# Patient Record
Sex: Female | Born: 1975 | Race: White | Hispanic: Yes | Marital: Married | Smoking: Never smoker
Health system: Southern US, Community
[De-identification: ages and names within clinical notes are randomized; demographics above are authoritative.]

## PROBLEM LIST (undated history)

## (undated) DIAGNOSIS — R251 Tremor, unspecified: Secondary | ICD-10-CM

## (undated) DIAGNOSIS — K589 Irritable bowel syndrome without diarrhea: Secondary | ICD-10-CM

## (undated) DIAGNOSIS — F419 Anxiety disorder, unspecified: Secondary | ICD-10-CM

## (undated) DIAGNOSIS — J40 Bronchitis, not specified as acute or chronic: Secondary | ICD-10-CM

## (undated) HISTORY — PX: TONSILLECTOMY: SUR1361

---

## 2007-12-21 LAB — HM COLONOSCOPY: HM Colonoscopy: NORMAL

## 2009-12-19 HISTORY — PX: KNEE SURGERY: SHX244

## 2010-12-19 HISTORY — PX: BREAST BIOPSY: SHX20

## 2010-12-19 HISTORY — PX: BREAST LUMPECTOMY: SHX2

## 2013-06-18 LAB — HM MAMMOGRAPHY: HM Mammogram: NORMAL

## 2013-11-18 LAB — HM PAP SMEAR: HM PAP: NORMAL

## 2013-12-03 ENCOUNTER — Ambulatory Visit: Payer: Self-pay

## 2014-06-19 LAB — CBC AND DIFFERENTIAL: Hemoglobin: 14.2 g/dL (ref 12.0–16.0)

## 2014-06-19 LAB — LIPID PANEL
CHOLESTEROL: 207 mg/dL — AB (ref 0–200)
HDL: 84 mg/dL — AB (ref 35–70)
LDL CALC: 112 mg/dL
Triglycerides: 56 mg/dL (ref 40–160)

## 2014-06-19 LAB — TSH: TSH: 2.2 u[IU]/mL (ref <=5.90)

## 2014-07-27 ENCOUNTER — Emergency Department: Payer: Self-pay | Admitting: Emergency Medicine

## 2014-09-23 ENCOUNTER — Ambulatory Visit: Payer: Self-pay | Admitting: Internal Medicine

## 2014-11-24 ENCOUNTER — Ambulatory Visit: Payer: Self-pay | Admitting: Physician Assistant

## 2014-12-19 HISTORY — PX: BREAST EXCISIONAL BIOPSY: SUR124

## 2015-04-16 ENCOUNTER — Ambulatory Visit
Admit: 2015-04-16 | Disposition: A | Discharge status: Home / Self Care | Payer: Self-pay | Attending: Internal Medicine | Admitting: Internal Medicine

## 2015-06-08 ENCOUNTER — Other Ambulatory Visit: Payer: Self-pay | Admitting: Internal Medicine

## 2015-06-19 HISTORY — PX: APPENDECTOMY: SHX54

## 2015-06-21 ENCOUNTER — Encounter: Payer: Self-pay | Admitting: Gynecology

## 2015-06-21 ENCOUNTER — Ambulatory Visit
Admission: EM | Admit: 2015-06-21 | Discharge: 2015-06-21 | Disposition: A | Discharge status: Home / Self Care | Payer: 59 | Attending: Family Medicine | Admitting: Family Medicine

## 2015-06-21 DIAGNOSIS — H9201 Otalgia, right ear: Secondary | ICD-10-CM

## 2015-06-21 HISTORY — DX: Anxiety disorder, unspecified: F41.9

## 2015-06-21 HISTORY — DX: Bronchitis, not specified as acute or chronic: J40

## 2015-06-21 HISTORY — DX: Irritable bowel syndrome, unspecified: K58.9

## 2015-06-21 HISTORY — DX: Tremor, unspecified: R25.1

## 2015-06-21 NOTE — ED Provider Notes (Signed)
Patient presents today with symptoms of right ear pain. Patient states that she has had the symptoms for the last 3 days. She denies any recent swimming. She denies any fever, nasal congestion, cough, headache, discharge from the ear. She did have a left ear infection about one and a half months ago. Denies any problems with hearing or dizziness.  ROS: Negative except mentioned above. Vitals as per Epic.  GENERAL: NAD HEENT: no pharyngeal erythema, no exudate, no erythema of TMs, no tenderness with movt of pinna, no discharge from ear, multiple ear piercings but no evidence of erythema or tenderness of sites, no cervical LAD RESP: CTA B CARD: RRR NEURO: CN II-XII grossly intact   A/P: Right otalgia-there does not appear to be any evidence of an ear infection at this time, asked patient to try ibuprofen and antihistamine/decongestant to see if this would improve her symptoms. If her symptoms do persist or worsen I do recommend that she follow up with her primary care physician or ENT.   Paulina Fusi, MD 06/21/15 1256

## 2015-06-21 NOTE — ED Notes (Signed)
Patient c/o Right ear infection. Pt. Stated pain at right ear x 3 days.

## 2015-06-24 ENCOUNTER — Other Ambulatory Visit: Payer: Self-pay | Admitting: Internal Medicine

## 2015-07-07 ENCOUNTER — Encounter: Payer: Self-pay | Admitting: Internal Medicine

## 2015-07-13 ENCOUNTER — Encounter: Payer: Self-pay | Admitting: Internal Medicine

## 2015-07-13 ENCOUNTER — Other Ambulatory Visit: Payer: Self-pay | Admitting: Internal Medicine

## 2015-07-13 DIAGNOSIS — K581 Irritable bowel syndrome with constipation: Secondary | ICD-10-CM | POA: Insufficient documentation

## 2015-07-13 DIAGNOSIS — J4520 Mild intermittent asthma, uncomplicated: Secondary | ICD-10-CM | POA: Insufficient documentation

## 2015-07-13 DIAGNOSIS — F419 Anxiety disorder, unspecified: Secondary | ICD-10-CM | POA: Insufficient documentation

## 2015-07-13 DIAGNOSIS — G250 Essential tremor: Secondary | ICD-10-CM | POA: Insufficient documentation

## 2015-07-14 ENCOUNTER — Ambulatory Visit (INDEPENDENT_AMBULATORY_CARE_PROVIDER_SITE_OTHER): Payer: 59 | Admitting: Internal Medicine

## 2015-07-14 ENCOUNTER — Encounter: Payer: Self-pay | Admitting: Internal Medicine

## 2015-07-14 VITALS — BP 110/64 | HR 80 | Ht 62.0 in | Wt 124.2 lb

## 2015-07-14 DIAGNOSIS — R21 Rash and other nonspecific skin eruption: Secondary | ICD-10-CM | POA: Diagnosis not present

## 2015-07-14 DIAGNOSIS — Z09 Encounter for follow-up examination after completed treatment for conditions other than malignant neoplasm: Secondary | ICD-10-CM | POA: Diagnosis not present

## 2015-07-14 NOTE — Progress Notes (Signed)
Date:  07/14/2015   Name:  Nancy Vega   DOB:  September 01, 1976   MRN:  [id number]   Chief Complaint: Hospitalization Follow-up Rash This is a new problem. The current episode started in the past 7 days. The problem has been gradually improving since onset. The rash is diffuse. The rash is characterized by redness. Associated with: received multiple medications at surgery. Pertinent negatives include no anorexia, cough, diarrhea, fatigue, fever, joint pain, shortness of breath or sore throat.   - Admitted to [facility] with acute appendicitis.  She is here for followup (surgeon won't see her for 2 weeks).  She is leaving to travel to [place] tomorrow.  She feels well with only minor abdominal discomfort.  The laparoscopic incisions are intact with skin glue.  Her bowels are moving normally.  She is eating normally.  She denies fever, chills or headache.  Review of Systems:  Review of Systems  Constitutional: Negative for fever, chills, appetite change and fatigue.  HENT: Negative for sore throat.   Respiratory: Negative for cough and shortness of breath.   Cardiovascular: Negative for chest pain.  Gastrointestinal: Negative for diarrhea, constipation, abdominal distention and anorexia.  Genitourinary: Negative for dysuria, urgency and pelvic pain.  Musculoskeletal: Negative for joint pain.  Skin: Positive for rash.    Patient Active Problem List   Diagnosis Date Noted  . Anxiety disorder 07/13/2015  . Chronic bronchitis, simple 07/13/2015  . Essential tremor 07/13/2015  . IBS (irritable bowel syndrome) 07/13/2015    Prior to Admission medications   Medication Sig Start Date End Date Taking? Authorizing Provider  buPROPion (WELLBUTRIN XL) 150 MG 24 hr tablet TAKE (1) TABLET BY MOUTH DAILY AT BEDTIME 06/24/15  Yes Glean Hess, MD  buPROPion (WELLBUTRIN XL) 300 MG 24 hr tablet Take 300 mg by mouth every morning.   Yes Historical Provider, MD  levonorgestrel (MIRENA) 20 MCG/24HR IUD 1 each  by Intrauterine route once.   Yes Historical Provider, MD  montelukast (SINGULAIR) 10 MG tablet TAKE ONE (1) TABLET BY MOUTH ONCE DAILY 06/08/15  Yes Glean Hess, MD  primidone (MYSOLINE) 50 MG tablet Take by mouth 2 (two) times daily.   Yes Historical Provider, MD  mometasone-formoterol (DULERA) 100-5 MCG/ACT AERO Inhale 2 puffs into the lungs 2 (two) times daily.    Historical Provider, MD    Allergies  Allergen Reactions  . Amoxicillin Hives  . Cefdinir Other (See Comments)  . Nsaids Other (See Comments)    Tremor intensity  . Tamiflu [Oseltamivir] Hives  . Zithromax [Azithromycin] Hives and Other (See Comments)    Past Surgical History  Procedure Laterality Date  . Appendectomy  06/2015  . Knee surgery Right 2011  . Breast excisional biopsy Right   . Tonsillectomy Bilateral     History  Substance Use Topics  . Smoking status: Never Smoker   . Smokeless tobacco: Not on file  . Alcohol Use: 1.2 oz/week    2 Standard drinks or equivalent per week     Comment: occassion     Medication list has been reviewed and updated.  Physical Examination:  Physical Exam  Constitutional: She appears well-developed and well-nourished.  Neck: Normal range of motion. Neck supple.  Cardiovascular: Normal rate, regular rhythm and normal heart sounds.   Pulmonary/Chest: Effort normal and breath sounds normal. She has no wheezes.  Abdominal: Soft. Bowel sounds are normal. She exhibits no distension. There is no tenderness. There is no rebound and no guarding.  Laparoscopic incisions  intact with skin glue  Musculoskeletal: She exhibits no edema.  Skin: Skin is warm and dry. Rash noted. Rash is papular (widespread pinpoint erythematous lesions, blanching, nontender).    BP 110/64 mmHg  Pulse 80  Ht 5\' 2"  (1.575 m)  Wt 124 lb 3.2 oz (56.337 kg)  BMI 22.71 kg/m2  Assessment and Plan: 1. S/P appendectomy, follow-up exam Appears stable - may proceed with travel plans Continue avoid  lifting >5 lbs for the next 2 weeks  2. Rash of unknown cause Possibly viral; medication reaction unlikely due to timing Since asymptomatic and gradually improving will observe   Halina Maidens, MD [facility]  07/14/2015

## 2015-09-16 ENCOUNTER — Other Ambulatory Visit: Payer: Self-pay | Admitting: Internal Medicine

## 2015-09-16 ENCOUNTER — Other Ambulatory Visit: Payer: Self-pay

## 2015-09-16 MED ORDER — BUPROPION HCL ER (XL) 300 MG PO TB24: 300.0000 mg | ORAL_TABLET | Freq: Every morning | ORAL | Status: DC

## 2015-09-30 ENCOUNTER — Ambulatory Visit
Admission: EM | Admit: 2015-09-30 | Discharge: 2015-09-30 | Disposition: A | Discharge status: Home / Self Care | Payer: 59 | Attending: Family Medicine | Admitting: Family Medicine

## 2015-09-30 ENCOUNTER — Encounter: Payer: Self-pay | Admitting: *Deleted

## 2015-09-30 DIAGNOSIS — J069 Acute upper respiratory infection, unspecified: Secondary | ICD-10-CM

## 2015-09-30 DIAGNOSIS — H6500 Acute serous otitis media, unspecified ear: Secondary | ICD-10-CM

## 2015-09-30 MED ORDER — BENZONATATE 200 MG PO CAPS: 200.0000 mg | ORAL_CAPSULE | Freq: Three times a day (TID) | ORAL | Status: DC | PRN

## 2015-09-30 MED ORDER — SULFAMETHOXAZOLE-TRIMETHOPRIM 800-160 MG PO TABS: 1.0000 | ORAL_TABLET | Freq: Two times a day (BID) | ORAL | Status: DC

## 2015-09-30 NOTE — ED Provider Notes (Signed)
CSN: [id number]     Arrival date & time 09/30/15  1724 History   None    Chief Complaint  Patient presents with  . Ear Fullness  . Sore Throat  . Cough   (Consider location/radiation/quality/duration/timing/severity/associated sxs/prior Treatment) HPI Comments: 39 yo female with a 2 days h/o ear pain, congestion, cough, sore throat, fevers. Denies chest pain or shortness of breath.   The history is provided by the patient.    Past Medical History  Diagnosis Date  . Anxiety   . Bronchitis     chronic  . Tremor   . IBS (irritable bowel syndrome)    Past Surgical History  Procedure Laterality Date  . Appendectomy  06/2015  . Knee surgery Right 2011  . Breast excisional biopsy Right   . Tonsillectomy Bilateral    Family History  Problem Relation Age of Onset  . Cancer Mother     Cervical   Social History  Substance Use Topics  . Smoking status: Never Smoker   . Smokeless tobacco: None  . Alcohol Use: 1.2 oz/week    2 Standard drinks or equivalent per week     Comment: occassion   OB History    No data available     Review of Systems  Allergies  Amoxicillin; Cefdinir; Nsaids; Tamiflu; and Zithromax  Home Medications   Prior to Admission medications   Medication Sig Start Date End Date Taking? Authorizing Provider  benzonatate (TESSALON) 200 MG capsule Take 1 capsule (200 mg total) by mouth 3 (three) times daily as needed for cough. 09/30/15   Norval Gable, MD  buPROPion (WELLBUTRIN XL) 150 MG 24 hr tablet TAKE (1) TABLET BY MOUTH DAILY AT BEDTIME 06/24/15   Glean Hess, MD  buPROPion (WELLBUTRIN XL) 300 MG 24 hr tablet Take 1 tablet (300 mg total) by mouth every morning. 09/16/15   Glean Hess, MD  levonorgestrel (MIRENA) 20 MCG/24HR IUD 1 each by Intrauterine route once.    Historical Provider, MD  mometasone-formoterol (DULERA) 100-5 MCG/ACT AERO Inhale 2 puffs into the lungs 2 (two) times daily.    Historical Provider, MD  montelukast (SINGULAIR) 10  MG tablet TAKE ONE (1) TABLET BY MOUTH ONCE DAILY 06/08/15   Glean Hess, MD  primidone (MYSOLINE) 50 MG tablet Take by mouth 2 (two) times daily.    Historical Provider, MD  sulfamethoxazole-trimethoprim (BACTRIM DS,SEPTRA DS) 800-160 MG tablet Take 1 tablet by mouth 2 (two) times daily. 09/30/15   Norval Gable, MD   Meds Ordered and Administered this Visit  Medications - No data to display  BP 129/87 mmHg  Pulse 98  Temp(Src) 98.1 F (36.7 C) (Oral)  Ht 5\' 2"  (1.575 m)  Wt 126 lb (57.153 kg)  BMI 23.04 kg/m2  SpO2 99% No data found.   Physical Exam  Constitutional: She appears well-developed and well-nourished. No distress.  HENT:  Head: Normocephalic.  Right Ear: Tympanic membrane is erythematous and bulging.  Left Ear: Tympanic membrane is erythematous and bulging.  Nose: Mucosal edema and rhinorrhea present.  Mouth/Throat: Mucous membranes are normal. Posterior oropharyngeal erythema present. No oropharyngeal exudate, posterior oropharyngeal edema or tonsillar abscesses.  Eyes: Conjunctivae are normal. Right eye exhibits no discharge. Left eye exhibits no discharge.  Neck: Neck supple.  Cardiovascular: Normal rate, regular rhythm and normal heart sounds.   Pulmonary/Chest: Effort normal and breath sounds normal. No respiratory distress. She has no wheezes. She has no rales.  Lymphadenopathy:    She has no cervical  adenopathy.  Neurological: She is alert.  Skin: No rash noted. She is not diaphoretic.  Nursing note and vitals reviewed.   ED Course  Procedures (including critical care time)  Labs Review Labs Reviewed - No data to display  Imaging Review No results found.   Visual Acuity Review  Right Eye Distance:   Left Eye Distance:   Bilateral Distance:    Right Eye Near:   Left Eye Near:    Bilateral Near:         MDM   1. Acute serous otitis media, recurrence not specified, unspecified laterality   2. URI (upper respiratory infection)     Discharge Medication List as of 09/30/2015  6:31 PM    START taking these medications   Details  benzonatate (TESSALON) 200 MG capsule Take 1 capsule (200 mg total) by mouth 3 (three) times daily as needed for cough., Starting 09/30/2015, Until Discontinued, Normal    sulfamethoxazole-trimethoprim (BACTRIM DS,SEPTRA DS) 800-160 MG tablet Take 1 tablet by mouth 2 (two) times daily., Starting 09/30/2015, Until Discontinued, Normal      1. diagnosis reviewed with patient 2. rx as per orders above; reviewed possible side effects, interactions, risks and benefits  3. Recommend supportive treatment with otc analgesics, rest, increased fluids 4. Follow prn if symptoms worsen or don't improve    Norval Gable, MD 09/30/15 [phone]

## 2015-09-30 NOTE — ED Notes (Signed)
Pt states that she has had sore throat, cough and ear full, that started yesterday

## 2015-11-15 ENCOUNTER — Other Ambulatory Visit: Payer: Self-pay | Admitting: Internal Medicine

## 2015-11-16 ENCOUNTER — Ambulatory Visit (INDEPENDENT_AMBULATORY_CARE_PROVIDER_SITE_OTHER): Payer: 59 | Admitting: Internal Medicine

## 2015-11-16 ENCOUNTER — Encounter: Payer: Self-pay | Admitting: Internal Medicine

## 2015-11-16 VITALS — BP 104/64 | HR 64 | Ht 62.0 in | Wt 123.4 lb

## 2015-11-16 DIAGNOSIS — R945 Abnormal results of liver function studies: Secondary | ICD-10-CM

## 2015-11-16 DIAGNOSIS — F411 Generalized anxiety disorder: Secondary | ICD-10-CM

## 2015-11-16 DIAGNOSIS — G250 Essential tremor: Secondary | ICD-10-CM

## 2015-11-16 DIAGNOSIS — E785 Hyperlipidemia, unspecified: Secondary | ICD-10-CM | POA: Diagnosis not present

## 2015-11-16 DIAGNOSIS — Z0000 Encounter for general adult medical examination without abnormal findings: Secondary | ICD-10-CM

## 2015-11-16 DIAGNOSIS — R7989 Other specified abnormal findings of blood chemistry: Secondary | ICD-10-CM | POA: Diagnosis not present

## 2015-11-16 DIAGNOSIS — J410 Simple chronic bronchitis: Secondary | ICD-10-CM | POA: Diagnosis not present

## 2015-11-16 DIAGNOSIS — K589 Irritable bowel syndrome without diarrhea: Secondary | ICD-10-CM | POA: Diagnosis not present

## 2015-11-16 LAB — POCT URINALYSIS DIPSTICK
BILIRUBIN UA: NEGATIVE
GLUCOSE UA: NEGATIVE
Ketones, UA: NEGATIVE
Leukocytes, UA: NEGATIVE
Nitrite, UA: NEGATIVE
Protein, UA: NEGATIVE
Urobilinogen, UA: 0.2
pH, UA: 5

## 2015-11-16 NOTE — Patient Instructions (Signed)
Breast Self-Awareness Practicing breast self-awareness may pick up problems early, prevent significant medical complications, and possibly save your life. By practicing breast self-awareness, you can become familiar with how your breasts look and feel and if your breasts are changing. This allows you to notice changes early. It can also offer you some reassurance that your breast health is good. One way to learn what is normal for your breasts and whether your breasts are changing is to do a breast self-exam. If you find a lump or something that was not present in the past, it is best to contact your caregiver right away. Other findings that should be evaluated by your caregiver include nipple discharge, especially if it is bloody; skin changes or reddening; areas where the skin seems to be pulled in (retracted); or new lumps and bumps. Breast pain is seldom associated with cancer (malignancy), but should also be evaluated by a caregiver. HOW TO PERFORM A BREAST SELF-EXAM The best time to examine your breasts is 5-7 days after your menstrual period is over. During menstruation, the breasts are lumpier, and it may be more difficult to pick up changes. If you do not menstruate, have reached menopause, or had your uterus removed (hysterectomy), you should examine your breasts at regular intervals, such as monthly. If you are breastfeeding, examine your breasts after a feeding or after using a breast pump. Breast implants do not decrease the risk for lumps or tumors, so continue to perform breast self-exams as recommended. Talk to your caregiver about how to determine the difference between the implant and breast tissue. Also, talk about the amount of pressure you should use during the exam. Over time, you will become more familiar with the variations of your breasts and more comfortable with the exam. A breast self-exam requires you to remove all your clothes above the waist. 1. Look at your breasts and nipples.  Stand in front of a mirror in a room with good lighting. With your hands on your hips, push your hands firmly downward. Look for a difference in shape, contour, and size from one breast to the other (asymmetry). Asymmetry includes puckers, dips, or bumps. Also, look for skin changes, such as reddened or scaly areas on the breasts. Look for nipple changes, such as discharge, dimpling, repositioning, or redness. 2. Carefully feel your breasts. This is best done either in the shower or tub while using soapy water or when flat on your back. Place the arm (on the side of the breast you are examining) above your head. Use the pads (not the fingertips) of your three middle fingers on your opposite hand to feel your breasts. Start in the underarm area and use  inch (2 cm) overlapping circles to feel your breast. Use 3 different levels of pressure (light, medium, and firm pressure) at each circle before moving to the next circle. The light pressure is needed to feel the tissue closest to the skin. The medium pressure will help to feel breast tissue a little deeper, while the firm pressure is needed to feel the tissue close to the ribs. Continue the overlapping circles, moving downward over the breast until you feel your ribs below your breast. Then, move one finger-width towards the center of the body. Continue to use the  inch (2 cm) overlapping circles to feel your breast as you move slowly up toward the collar bone (clavicle) near the base of the neck. Continue the up and down exam using all 3 pressures until you reach the   middle of the chest. Do this with each breast, carefully feeling for lumps or changes. 3.  Keep a written record with breast changes or normal findings for each breast. By writing this information down, you do not need to depend only on memory for size, tenderness, or location. Write down where you are in your menstrual cycle, if you are still menstruating. Breast tissue can have some lumps or  thick tissue. However, see your caregiver if you find anything that concerns you.  SEEK MEDICAL CARE IF:  You see a change in shape, contour, or size of your breasts or nipples.   You see skin changes, such as reddened or scaly areas on the breasts or nipples.   You have an unusual discharge from your nipples.   You feel a new lump or unusually thick areas.    This information is not intended to replace advice given to you by your health care provider. Make sure you discuss any questions you have with your health care provider.   Document Released: 12/05/2005 Document Revised: 11/21/2012 Document Reviewed: 03/21/2012 Elsevier Interactive Patient Education 2016 [employer]  

## 2015-11-16 NOTE — Progress Notes (Signed)
Date:  11/16/2015   Name:  Nancy Vega   DOB:  September 18, 1976   MRN:  [id number]   Chief Complaint: Annual Exam Nancy Vega is a 39 y.o. female who presents today for her Complete Annual Exam. She feels well. She reports exercising regularly. She reports she is sleeping well. 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 Anxiety disorder - doing well on bupropion 450 mg daily. She is coping well on traveling without difficulty. She uses Xanax for long air travel but otherwise does not take it. Tremor - recently seen by neurology. Dose was not adjusted due to intolerance of higher doses in the past. She is coping well with the tremor and it does not interfere with her daily activities. Health maintenance - plans to see GYN in [place] state next week  Review of Systems  Constitutional: Negative for fever, chills and fatigue.  HENT: Negative for hearing loss, sinus pressure, tinnitus, trouble swallowing and voice change.   Eyes: Negative for visual disturbance.  Respiratory: Negative for cough, chest tightness, shortness of breath and wheezing.   Cardiovascular: Negative for chest pain, palpitations and leg swelling.  Gastrointestinal: Negative for abdominal pain and blood in stool.  Endocrine: Positive for cold intolerance. Negative for polydipsia and polyuria.  Genitourinary: Negative for dysuria, urgency, frequency, vaginal bleeding and vaginal discharge.  Musculoskeletal: Positive for arthralgias. Negative for myalgias and joint swelling.  Skin: Positive for pallor. Negative for rash and wound.  Allergic/Immunologic: Negative for environmental allergies.  Neurological: Positive for tremors. Negative for light-headedness, numbness and headaches.  Psychiatric/Behavioral: Negative for suicidal ideas and dysphoric mood.    Patient Active Problem List   Diagnosis Date Noted  . Anxiety disorder 07/13/2015  . Chronic bronchitis, simple ([place])  07/13/2015  . Essential tremor 07/13/2015  . IBS (irritable bowel syndrome) 07/13/2015    Prior to Admission medications   Medication Sig Start Date End Date Taking? Authorizing Provider  ALPRAZolam Duanne Moron) 0.5 MG tablet Take 1 tablet by mouth 2 (two) times daily as needed.   Yes Historical Provider, MD  benzonatate (TESSALON) 200 MG capsule Take 1 capsule (200 mg total) by mouth 3 (three) times daily as needed for cough. 09/30/15  Yes Norval Gable, MD  buPROPion (WELLBUTRIN XL) 150 MG 24 hr tablet TAKE (1) TABLET BY MOUTH DAILY AT BEDTIME 06/24/15  Yes Glean Hess, MD  buPROPion (WELLBUTRIN XL) 300 MG 24 hr tablet Take 1 tablet (300 mg total) by mouth every morning. 09/16/15  Yes Glean Hess, MD  levonorgestrel (MIRENA) 20 MCG/24HR IUD 1 each by Intrauterine route once.   Yes Historical Provider, MD  montelukast (SINGULAIR) 10 MG tablet TAKE ONE (1) TABLET BY MOUTH ONCE DAILY 06/08/15  Yes Glean Hess, MD  primidone (MYSOLINE) 50 MG tablet Take by mouth 2 (two) times daily.   Yes Historical Provider, MD  mometasone-formoterol (DULERA) 100-5 MCG/ACT AERO Inhale 2 puffs into the lungs 2 (two) times daily.    Historical Provider, MD    Allergies  Allergen Reactions  . Amoxicillin Hives  . Cefdinir Other (See Comments)  . Nsaids Other (See Comments)    Tremor intensity  . Tamiflu [Oseltamivir] Hives  . Zithromax [Azithromycin] Hives and Other (See Comments)    Past Surgical History  Procedure Laterality Date  . Appendectomy  06/2015  . Knee surgery Right 2011  . Breast excisional biopsy Right   . Tonsillectomy Bilateral     Social  History  Substance Use Topics  . Smoking status: Never Smoker   . Smokeless tobacco: Not on file  . Alcohol Use: 1.2 oz/week    2 Standard drinks or equivalent per week     Comment: occassion     Medication list has been reviewed and updated.   Physical Exam  Constitutional: She is oriented to person, place, and time. She appears  well-developed and well-nourished. No distress.  HENT:  Head: Normocephalic and atraumatic.  Right Ear: Tympanic membrane and ear canal normal.  Left Ear: Tympanic membrane and ear canal normal.  Nose: Right sinus exhibits no maxillary sinus tenderness. Left sinus exhibits no maxillary sinus tenderness.  Mouth/Throat: Uvula is midline and oropharynx is clear and moist.  Eyes: Conjunctivae and EOM are normal. Right eye exhibits no discharge. Left eye exhibits no discharge. No scleral icterus.  Neck: Normal range of motion. Carotid bruit is not present. No erythema present. No thyromegaly present.  Cardiovascular: Normal rate, regular rhythm, normal heart sounds and normal pulses.   Pulmonary/Chest: Effort normal. No respiratory distress. She has no wheezes.  Abdominal: Soft. Bowel sounds are normal. There is no hepatosplenomegaly. There is generalized tenderness. There is no CVA tenderness.  Musculoskeletal: Normal range of motion.  Lymphadenopathy:    She has no cervical adenopathy.    She has no axillary adenopathy.  Neurological: She is alert and oriented to person, place, and time. She has normal reflexes. No cranial nerve deficit or sensory deficit.  Skin: Skin is warm, dry and intact. No rash noted.  Psychiatric: She has a normal mood and affect. Her speech is normal and behavior is normal. Thought content normal.  Nursing note and vitals reviewed.   BP 104/64 mmHg  Pulse 64  Ht 5\' 2"  (1.575 m)  Wt 123 lb 6.4 oz (55.974 kg)  BMI 22.56 kg/m2  Assessment and Plan: 1. Annual physical exam Continue self breast exams and follow-up with GYN as planned - POCT urinalysis dipstick  2. IBS (irritable bowel syndrome) Stable currently with minimal symptoms - CBC with Differential/Platelet  3. Essential tremor Controlled on primidone - TSH  4. Generalized anxiety disorder Doing well with bupropion; continue current therapy  5. Chronic bronchitis, simple ([place]) No recent  symptoms Continue Singulair and Dulera  6. Elevated LFTs Being monitored due to chronic medications - Comprehensive metabolic panel  7. Mild hyperlipidemia Will check lipids and advise - Lipid panel   Halina Maidens, MD [facility]  11/16/2015

## 2015-11-17 LAB — COMPREHENSIVE METABOLIC PANEL
A/G RATIO: 2.2 (ref 1.1–2.5)
ALBUMIN: 4.8 g/dL (ref 3.5–5.5)
ALK PHOS: 74 IU/L (ref 39–117)
ALT: 19 IU/L (ref 0–32)
AST: 17 IU/L (ref 0–40)
BUN / CREAT RATIO: 17 (ref 8–20)
BUN: 19 mg/dL (ref 6–20)
Bilirubin Total: 0.7 mg/dL (ref 0.0–1.2)
CO2: 24 mmol/L (ref 18–29)
CREATININE: 1.09 mg/dL — AB (ref 0.57–1.00)
Calcium: 9.2 mg/dL (ref 8.7–10.2)
Chloride: 98 mmol/L (ref 97–106)
GFR calc Af Amer: 74 mL/min/{1.73_m2} (ref >59)
GFR calc non Af Amer: 64 mL/min/{1.73_m2} (ref >59)
GLOBULIN, TOTAL: 2.2 g/dL (ref 1.5–4.5)
Glucose: 70 mg/dL (ref 65–99)
POTASSIUM: 4.4 mmol/L (ref 3.5–5.2)
SODIUM: 140 mmol/L (ref 136–144)
Total Protein: 7 g/dL (ref 6.0–8.5)

## 2015-11-17 LAB — LIPID PANEL
CHOL/HDL RATIO: 2.6 ratio (ref 0.0–4.4)
CHOLESTEROL TOTAL: 198 mg/dL (ref 100–199)
HDL: 77 mg/dL (ref >39)
LDL CALC: 112 mg/dL — AB (ref 0–99)
Triglycerides: 45 mg/dL (ref 0–149)
VLDL Cholesterol Cal: 9 mg/dL (ref 5–40)

## 2015-11-17 LAB — CBC WITH DIFFERENTIAL/PLATELET
BASOS: 0 %
Basophils Absolute: 0 10*3/uL (ref 0.0–0.2)
EOS (ABSOLUTE): 0 10*3/uL (ref 0.0–0.4)
EOS: 0 %
Hematocrit: 40.6 % (ref 34.0–46.6)
Hemoglobin: 13.8 g/dL (ref 11.1–15.9)
Immature Grans (Abs): 0 10*3/uL (ref 0.0–0.1)
Immature Granulocytes: 0 %
LYMPHS: 23 %
Lymphocytes Absolute: 1.7 10*3/uL (ref 0.7–3.1)
MCH: 31.9 pg (ref 26.6–33.0)
MCHC: 34 g/dL (ref 31.5–35.7)
MCV: 94 fL (ref 79–97)
Monocytes Absolute: 0.4 10*3/uL (ref 0.1–0.9)
Monocytes: 5 %
NEUTROS PCT: 72 %
Neutrophils Absolute: 5.3 10*3/uL (ref 1.4–7.0)
PLATELETS: 293 10*3/uL (ref 150–379)
RBC: 4.33 x10E6/uL (ref 3.77–5.28)
RDW: 13.4 % (ref 12.3–15.4)
WBC: 7.4 10*3/uL (ref 3.4–10.8)

## 2015-11-17 LAB — TSH: TSH: 2.02 u[IU]/mL (ref 0.450–4.500)

## 2016-03-21 ENCOUNTER — Other Ambulatory Visit: Payer: Self-pay | Admitting: Internal Medicine

## 2016-03-21 DIAGNOSIS — N6011 Diffuse cystic mastopathy of right breast: Secondary | ICD-10-CM | POA: Insufficient documentation

## 2016-03-21 DIAGNOSIS — R922 Inconclusive mammogram: Secondary | ICD-10-CM

## 2016-03-21 MED ORDER — BUPROPION HCL ER (XL) 150 MG PO TB24: 150.0000 mg | ORAL_TABLET | Freq: Every day | ORAL | Status: DC

## 2016-04-18 HISTORY — PX: BREAST BIOPSY: SHX20

## 2016-04-18 HISTORY — PX: BREAST EXCISIONAL BIOPSY: SUR124

## 2016-04-19 ENCOUNTER — Other Ambulatory Visit: Payer: 59

## 2016-04-19 ENCOUNTER — Ambulatory Visit: Payer: 59

## 2016-04-28 ENCOUNTER — Ambulatory Visit
Admission: RE | Admit: 2016-04-28 | Discharge: 2016-04-28 | Disposition: A | Discharge status: Home / Self Care | Payer: BLUE CROSS/BLUE SHIELD | Source: Ambulatory Visit | Attending: Internal Medicine | Admitting: Internal Medicine

## 2016-04-28 DIAGNOSIS — R922 Inconclusive mammogram: Secondary | ICD-10-CM | POA: Diagnosis present

## 2016-04-28 DIAGNOSIS — R928 Other abnormal and inconclusive findings on diagnostic imaging of breast: Secondary | ICD-10-CM | POA: Insufficient documentation

## 2016-04-28 DIAGNOSIS — N63 Unspecified lump in breast: Secondary | ICD-10-CM | POA: Insufficient documentation

## 2016-04-29 ENCOUNTER — Other Ambulatory Visit: Payer: Self-pay | Admitting: Internal Medicine

## 2016-04-29 DIAGNOSIS — R928 Other abnormal and inconclusive findings on diagnostic imaging of breast: Secondary | ICD-10-CM

## 2016-05-02 ENCOUNTER — Encounter: Payer: Self-pay | Admitting: Emergency Medicine

## 2016-05-02 ENCOUNTER — Ambulatory Visit
Admission: EM | Admit: 2016-05-02 | Discharge: 2016-05-02 | Disposition: A | Discharge status: Home / Self Care | Payer: 59 | Attending: Family Medicine | Admitting: Family Medicine

## 2016-05-02 DIAGNOSIS — N390 Urinary tract infection, site not specified: Secondary | ICD-10-CM

## 2016-05-02 LAB — URINALYSIS COMPLETE WITH MICROSCOPIC (ARMC ONLY)
Bilirubin Urine: NEGATIVE
Glucose, UA: NEGATIVE mg/dL
KETONES UR: NEGATIVE mg/dL
Nitrite: NEGATIVE
PH: 7.5 (ref 5.0–8.0)
PROTEIN: 100 mg/dL — AB
SPECIFIC GRAVITY, URINE: 1.02 (ref 1.005–1.030)

## 2016-05-02 MED ORDER — SULFAMETHOXAZOLE-TRIMETHOPRIM 800-160 MG PO TABS: 1.0000 | ORAL_TABLET | Freq: Two times a day (BID) | ORAL | Status: DC

## 2016-05-02 NOTE — ED Provider Notes (Signed)
CSN: [id number]     Arrival date & time 05/02/16  1832 History   First MD Initiated Contact with Patient 05/02/16 1855     Chief Complaint  Patient presents with  . Urinary Tract Infection   (Consider location/radiation/quality/duration/timing/severity/associated sxs/prior Treatment) Patient is a 40 y.o. female presenting with urinary tract infection.  Urinary Tract Infection Pain quality:  Burning Pain severity:  Mild Onset quality:  Sudden Duration:  1 day Timing:  Constant Progression:  Worsening Chronicity:  New Recent urinary tract infections: no   Relieved by:  None tried Urinary symptoms: hematuria   Urinary symptoms: no discolored urine, no foul-smelling urine, no frequent urination, no hesitancy and no bladder incontinence   Associated symptoms: no abdominal pain, no fever, no flank pain, no genital lesions, no nausea, no vaginal discharge and no vomiting   Risk factors: no hx of pyelonephritis, no hx of urolithiasis, no kidney transplant, not pregnant, no recurrent urinary tract infections, no renal cysts, no renal disease, not sexually active, not single kidney, no sexually transmitted infections and no urinary catheter     Past Medical History  Diagnosis Date  . Anxiety   . Bronchitis     chronic  . Tremor   . IBS (irritable bowel syndrome)    Past Surgical History  Procedure Laterality Date  . Appendectomy  06/2015  . Knee surgery Right 2011  . Breast excisional biopsy Right   . Tonsillectomy Bilateral    Family History  Problem Relation Age of Onset  . Cancer Mother     Cervical  . Breast cancer Neg Hx    Social History  Substance Use Topics  . Smoking status: Never Smoker   . Smokeless tobacco: None  . Alcohol Use: 1.2 oz/week    2 Standard drinks or equivalent per week     Comment: occassion   OB History    No data available     Review of Systems  Constitutional: Negative for fever.  Gastrointestinal: Negative for nausea, vomiting and  abdominal pain.  Genitourinary: Negative for flank pain and vaginal discharge.    Allergies  Amoxicillin; Cefdinir; Nsaids; Tamiflu; and Zithromax  Home Medications   Prior to Admission medications   Medication Sig Start Date End Date Taking? Authorizing Provider  ALPRAZolam Duanne Moron) 0.5 MG tablet Take 1 tablet by mouth 2 (two) times daily as needed.    Historical Provider, MD  benzonatate (TESSALON) 200 MG capsule Take 1 capsule (200 mg total) by mouth 3 (three) times daily as needed for cough. 09/30/15   Norval Gable, MD  buPROPion (WELLBUTRIN XL) 150 MG 24 hr tablet Take 1 tablet (150 mg total) by mouth daily. 03/21/16   Glean Hess, MD  buPROPion (WELLBUTRIN XL) 300 MG 24 hr tablet Take 1 tablet (300 mg total) by mouth every morning. 09/16/15   Glean Hess, MD  levonorgestrel (MIRENA) 20 MCG/24HR IUD 1 each by Intrauterine route once.    Historical Provider, MD  mometasone-formoterol (DULERA) 100-5 MCG/ACT AERO Inhale 2 puffs into the lungs 2 (two) times daily.    Historical Provider, MD  montelukast (SINGULAIR) 10 MG tablet TAKE ONE (1) TABLET BY MOUTH ONCE DAILY 06/08/15   Glean Hess, MD  primidone (MYSOLINE) 50 MG tablet Take by mouth 2 (two) times daily.    Historical Provider, MD  sulfamethoxazole-trimethoprim (BACTRIM DS,SEPTRA DS) 800-160 MG tablet Take 1 tablet by mouth 2 (two) times daily. 05/02/16   Norval Gable, MD   Meds Ordered and Administered  this Visit  Medications - No data to display  BP 151/81 mmHg  Pulse 96  Temp(Src) 98.5 F (36.9 C) (Tympanic)  Resp 20  SpO2 99% No data found.   Physical Exam  Constitutional: She appears well-developed and well-nourished. No distress.  Abdominal: Soft. Bowel sounds are normal. She exhibits no distension and no mass. There is tenderness (mild suprapubic). There is no rebound and no guarding.  Skin: She is not diaphoretic.  Nursing note and vitals reviewed.   ED Course  Procedures (including critical care  time)  Labs Review Labs Reviewed  URINALYSIS COMPLETEWITH MICROSCOPIC ([facility] ONLY) - Abnormal; Notable for the following:    Color, Urine RED (*)    APPearance CLOUDY (*)    Hgb urine dipstick 3+ (*)    Protein, ur 100 (*)    Leukocytes, UA 1+ (*)    Bacteria, UA FEW (*)    Squamous Epithelial / LPF 0-5 (*)    All other components within normal limits  URINE CULTURE    Imaging Review No results found.   Visual Acuity Review  Right Eye Distance:   Left Eye Distance:   Bilateral Distance:    Right Eye Near:   Left Eye Near:    Bilateral Near:         MDM   1. UTI (lower urinary tract infection)    Discharge Medication List as of 05/02/2016  7:19 PM    START taking these medications   Details  sulfamethoxazole-trimethoprim (BACTRIM DS,SEPTRA DS) 800-160 MG tablet Take 1 tablet by mouth 2 (two) times daily., Starting 05/02/2016, Until Discontinued, Normal       1. Lab results and diagnosis reviewed with patient 2. rx as per orders above; reviewed possible side effects, interactions, risks and benefits  3. Recommend supportive treatment with increased fluids 4. Follow-up prn if symptoms worsen or don't improve    Norval Gable, MD 05/02/16 1926

## 2016-05-02 NOTE — ED Notes (Signed)
Pt with UTI sx started last night.

## 2016-05-05 LAB — URINE CULTURE
Culture: >=100000 — AB
Special Requests: NORMAL

## 2016-05-09 ENCOUNTER — Ambulatory Visit
Admission: RE | Admit: 2016-05-09 | Discharge: 2016-05-09 | Disposition: A | Discharge status: Home / Self Care | Payer: BLUE CROSS/BLUE SHIELD | Source: Ambulatory Visit | Attending: Internal Medicine | Admitting: Internal Medicine

## 2016-05-09 ENCOUNTER — Ambulatory Visit: Payer: 59

## 2016-05-09 ENCOUNTER — Other Ambulatory Visit: Payer: 59

## 2016-05-09 ENCOUNTER — Other Ambulatory Visit: Payer: Self-pay | Admitting: Internal Medicine

## 2016-05-09 DIAGNOSIS — D242 Benign neoplasm of left breast: Secondary | ICD-10-CM | POA: Diagnosis not present

## 2016-05-09 DIAGNOSIS — R928 Other abnormal and inconclusive findings on diagnostic imaging of breast: Secondary | ICD-10-CM

## 2016-05-10 LAB — SURGICAL PATHOLOGY

## 2016-06-16 ENCOUNTER — Other Ambulatory Visit: Payer: Self-pay | Admitting: Internal Medicine

## 2016-07-26 ENCOUNTER — Ambulatory Visit (INDEPENDENT_AMBULATORY_CARE_PROVIDER_SITE_OTHER): Payer: 59

## 2016-07-26 ENCOUNTER — Ambulatory Visit
Admission: EM | Admit: 2016-07-26 | Discharge: 2016-07-26 | Disposition: A | Discharge status: Home / Self Care | Payer: 59 | Attending: Family Medicine | Admitting: Family Medicine

## 2016-07-26 ENCOUNTER — Encounter: Payer: Self-pay | Admitting: Emergency Medicine

## 2016-07-26 DIAGNOSIS — S9031XA Contusion of right foot, initial encounter: Secondary | ICD-10-CM

## 2016-07-26 DIAGNOSIS — M79604 Pain in right leg: Secondary | ICD-10-CM | POA: Diagnosis not present

## 2016-07-26 DIAGNOSIS — S93601A Unspecified sprain of right foot, initial encounter: Secondary | ICD-10-CM | POA: Diagnosis not present

## 2016-07-26 NOTE — ED Provider Notes (Signed)
[facility]    CSN: [id number] Arrival date & time: 07/26/16  1857  First Provider Contact:  First MD Initiated Contact with Patient 07/26/16 1944        History   Chief Complaint Chief Complaint  Patient presents with  . Foot Pain    HPI Nancy Vega is a 40 y.o. female.   Patient is here after being injured. She states that since she'll child heavyset was unhappy that she could not have anymore candy out of the patient's bag. When the child's mother told her that she was unable to have a more candy the patient states the child stopped her right foot hard in her frustration. And then today she was tending her garden with a painful right foot when she twisted right ankle and foot. It was inversion injury with for the swelling of the right foot resulting in significant amount pain and discomfort and reduced mobility. The patient's had had stress fractures in the right foot before and she states the last time she had wanted wasn't detected right away.  Past smoker history positive for some anxiety IBS and the tremor. She's had tonsillectomy appendectomy knee surgery and several breast biopsies. Mother with cervical cancer she never smoked. She is allergic to Zithromax, Tamiflu, Cefdnir, amoxicillin and NSAIDs.   The history is provided by the patient. No language interpreter was used.  Foot Pain  This is a new problem. The problem has been gradually worsening. Pertinent negatives include no chest pain, no abdominal pain, no headaches and no shortness of breath. The symptoms are aggravated by walking and twisting. Nothing relieves the symptoms. She has tried nothing for the symptoms. The treatment provided no relief.    Past Medical History:  Diagnosis Date  . Anxiety   . Bronchitis    chronic  . IBS (irritable bowel syndrome)   . Tremor     Patient Active Problem List   Diagnosis Date Noted  . Dense breast tissue 03/21/2016  . Anxiety disorder 07/13/2015    . Chronic bronchitis, simple ([place]) 07/13/2015  . Essential tremor 07/13/2015  . IBS (irritable bowel syndrome) 07/13/2015    Past Surgical History:  Procedure Laterality Date  . APPENDECTOMY  06/2015  . BREAST EXCISIONAL BIOPSY Right   . BREAST EXCISIONAL BIOPSY Left 04/2016   fibroadenoma  . KNEE SURGERY Right 2011  . TONSILLECTOMY Bilateral     OB History    No data available       Home Medications    Prior to Admission medications   Medication Sig Start Date End Date Taking? Authorizing Provider  buPROPion (WELLBUTRIN XL) 300 MG 24 hr tablet Take 1 tablet (300 mg total) by mouth every morning. 09/16/15  Yes Glean Hess, MD  montelukast (SINGULAIR) 10 MG tablet TAKE ONE (1) TABLET BY MOUTH ONCE DAILY 06/16/16  Yes Glean Hess, MD  primidone (MYSOLINE) 50 MG tablet Take by mouth 2 (two) times daily.   Yes Historical Provider, MD  ALPRAZolam Duanne Moron) 0.5 MG tablet Take 1 tablet by mouth 2 (two) times daily as needed.    Historical Provider, MD  benzonatate (TESSALON) 200 MG capsule Take 1 capsule (200 mg total) by mouth 3 (three) times daily as needed for cough. 09/30/15   Norval Gable, MD  buPROPion (WELLBUTRIN XL) 150 MG 24 hr tablet Take 1 tablet (150 mg total) by mouth daily. 03/21/16   Glean Hess, MD  levonorgestrel (MIRENA) 20 MCG/24HR IUD 1 each by Intrauterine  route once.    Historical Provider, MD  mometasone-formoterol (DULERA) 100-5 MCG/ACT AERO Inhale 2 puffs into the lungs 2 (two) times daily.    Historical Provider, MD  sulfamethoxazole-trimethoprim (BACTRIM DS,SEPTRA DS) 800-160 MG tablet Take 1 tablet by mouth 2 (two) times daily. 05/02/16   Norval Gable, MD    Family History Family History  Problem Relation Age of Onset  . Cancer Mother     Cervical  . Breast cancer Neg Hx     Social History Social History  Substance Use Topics  . Smoking status: Never Smoker  . Smokeless tobacco: Never Used  . Alcohol use 1.2 oz/week    2 Standard drinks  or equivalent per week     Comment: occassion     Allergies   Amoxicillin; Cefdinir; Iodine solution [povidone iodine]; Nsaids; Tamiflu [oseltamivir]; and Zithromax [azithromycin]   Review of Systems Review of Systems  Respiratory: Negative for shortness of breath.   Cardiovascular: Negative for chest pain.  Gastrointestinal: Negative for abdominal pain.  Neurological: Negative for headaches.  All other systems reviewed and are negative.    Physical Exam Triage Vital Signs ED Triage Vitals  Enc Vitals Group     BP 07/26/16 1937 (!) 118/91     Pulse Rate 07/26/16 1937 83     Resp 07/26/16 1937 18     Temp 07/26/16 1937 98.9 F (37.2 C)     Temp Source 07/26/16 1937 Tympanic     SpO2 07/26/16 1937 100 %     Weight 07/26/16 1937 135 lb (61.2 kg)     Height 07/26/16 1937 5\' 2"  (1.575 m)     Head Circumference --      Peak Flow --      Pain Score 07/26/16 1936 5     Pain Loc --      Pain Edu? --      Excl. in [place]? --    No data found.   Updated Vital Signs BP (!) 118/91 (BP Location: Left Arm)   Pulse 83   Temp 98.9 F (37.2 C) (Tympanic)   Resp 18   Ht 5\' 2"  (1.575 m)   Wt 135 lb (61.2 kg)   SpO2 100%   BMI 24.69 kg/m   Visual Acuity Right Eye Distance:   Left Eye Distance:   Bilateral Distance:    Right Eye Near:   Left Eye Near:    Bilateral Near:     Physical Exam  Constitutional: She is oriented to person, place, and time. She appears well-developed and well-nourished.  HENT:  Head: Normocephalic.  Eyes: Pupils are equal, round, and reactive to light.  Neck: Normal range of motion.  Pulmonary/Chest: Effort normal.  Musculoskeletal: She exhibits edema and tenderness.       Feet:  Patient with tenderness over the fourth and fifth mid metatarsal bones on the right foot there is swelling and ecchymosis present as is intact.  Neurological: She is alert and oriented to person, place, and time.  Skin: Skin is warm.  Psychiatric: She has a normal  mood and affect.  Vitals reviewed.    UC Treatments / Results  Labs (all labs ordered are listed, but only abnormal results are displayed) Labs Reviewed - No data to display  EKG  EKG Interpretation None       Radiology Dg Foot Complete Right  Result Date: 07/26/2016 CLINICAL DATA:  Lateral foot pain from someone stepping on her foot this past weekend and rolling her foot  yesterday. EXAM: RIGHT FOOT COMPLETE - 3+ VIEW COMPARISON:  None. FINDINGS: There is no evidence of fracture or dislocation. There is no evidence of arthropathy or other focal bone abnormality. Soft tissues are unremarkable. IMPRESSION: Negative. Electronically Signed   By: Misty Stanley M.D.   On: 07/26/2016 20:14    Procedures Procedures (including critical care time)  Medications Ordered in UC Medications - No data to display   Initial Impression / Assessment and Plan / UC Course  I have reviewed the triage vital signs and the nursing notes.  Pertinent labs & imaging results that were available during my care of the patient were reviewed by me and considered in my medical decision making (see chart for details).  Clinical Course    X-rays are negative for fracture. This patient has had fractures of the bones before recommend Cam Walker she degrees to for protection use a Cam Walker for about 2 weeks if not better in 2 weeks would recommend repeat x-ray. For the time being ice packs when able to reduce the swelling.  Final Clinical Impressions(s) / UC Diagnoses   Final diagnoses:  Foot contusion, right, initial encounter  Pain of right lower extremity  Foot sprain, right, initial encounter    New Prescriptions Discharge Medication List as of 07/26/2016  8:55 PM       Frederich Cha, MD 07/26/16 2129

## 2016-07-26 NOTE — ED Triage Notes (Signed)
Patient states she thinks she has a stress fracture in her right foot.  Patient states she is prone to stress fracture

## 2016-08-11 NOTE — ED Notes (Signed)
Patient fitted with a CAM walker boot

## 2016-08-23 ENCOUNTER — Other Ambulatory Visit: Payer: Self-pay | Admitting: Internal Medicine

## 2016-10-04 ENCOUNTER — Other Ambulatory Visit: Payer: Self-pay | Admitting: Internal Medicine

## 2016-10-05 NOTE — Telephone Encounter (Signed)
Pt coming in on 3/13

## 2016-12-21 ENCOUNTER — Other Ambulatory Visit: Payer: Self-pay | Admitting: Internal Medicine

## 2017-02-07 ENCOUNTER — Other Ambulatory Visit: Payer: Self-pay | Admitting: Internal Medicine

## 2017-02-08 ENCOUNTER — Encounter: Payer: Self-pay | Admitting: Internal Medicine

## 2017-02-08 ENCOUNTER — Other Ambulatory Visit: Payer: Self-pay | Admitting: Internal Medicine

## 2017-02-08 ENCOUNTER — Ambulatory Visit (INDEPENDENT_AMBULATORY_CARE_PROVIDER_SITE_OTHER): Payer: 59 | Admitting: Internal Medicine

## 2017-02-08 VITALS — BP 112/64 | HR 98 | Ht 62.0 in | Wt 131.2 lb

## 2017-02-08 DIAGNOSIS — N3001 Acute cystitis with hematuria: Secondary | ICD-10-CM

## 2017-02-08 LAB — POCT URINALYSIS DIPSTICK
BILIRUBIN UA: NEGATIVE
Glucose, UA: NEGATIVE
Ketones, UA: NEGATIVE
NITRITE UA: POSITIVE
PH UA: 7
Protein, UA: NEGATIVE
Spec Grav, UA: 1.01
UROBILINOGEN UA: 0.2

## 2017-02-08 MED ORDER — SULFAMETHOXAZOLE-TRIMETHOPRIM 800-160 MG PO TABS: 1.0000 | ORAL_TABLET | Freq: Two times a day (BID) | ORAL | 0 refills | Status: DC

## 2017-02-08 NOTE — Progress Notes (Signed)
Date:  02/08/2017   Name:  Nancy Vega   DOB:  Feb 10, 1976   MRN:  [id number]   Chief Complaint: Urinary Tract Infection (burning, pain, and blood during urination.) Urinary Tract Infection   This is a new problem. The current episode started today. The problem occurs every urination. The problem has been unchanged. The quality of the pain is described as burning. The pain is at a severity of 5/10. The pain is moderate. There has been no fever. Associated symptoms include frequency, hematuria, hesitancy and urgency. Pertinent negatives include no chills or flank pain. She has tried increased fluids and NSAIDs for the symptoms. The treatment provided mild relief.      Review of Systems  Constitutional: Negative for chills, fatigue and fever.  Respiratory: Negative for shortness of breath.   Cardiovascular: Negative for chest pain, palpitations and leg swelling.  Genitourinary: Positive for frequency, hematuria, hesitancy and urgency. Negative for flank pain.  Neurological: Negative for dizziness.    Patient Active Problem List   Diagnosis Date Noted  . Dense breast tissue 03/21/2016  . Anxiety disorder 07/13/2015  . Chronic bronchitis, simple ([place]) 07/13/2015  . Essential tremor 07/13/2015  . IBS (irritable bowel syndrome) 07/13/2015    Prior to Admission medications   Medication Sig Start Date End Date Taking? Authorizing Provider  buPROPion (WELLBUTRIN XL) 300 MG 24 hr tablet TAKE ONE TABLET EVERY MORNING Patient taking differently: twice daily 12/21/16  Yes Glean Hess, MD  levonorgestrel (MIRENA) 20 MCG/24HR IUD 1 each by Intrauterine route once.   Yes Historical Provider, MD  montelukast (SINGULAIR) 10 MG tablet TAKE ONE (1) TABLET BY MOUTH ONCE DAILY 06/16/16  Yes Glean Hess, MD  primidone (MYSOLINE) 50 MG tablet Take by mouth 2 (two) times daily.   Yes Historical Provider, MD  ALPRAZolam Duanne Moron) 0.5 MG tablet Take 1 tablet by mouth 2 (two) times daily as  needed.    Historical Provider, MD  benzonatate (TESSALON) 200 MG capsule Take 1 capsule (200 mg total) by mouth 3 (three) times daily as needed for cough. Patient not taking: Reported on 02/08/2017 09/30/15   Norval Gable, MD  mometasone-formoterol [facility]) 100-5 MCG/ACT AERO Inhale 2 puffs into the lungs 2 (two) times daily.    Historical Provider, MD  propranolol (INDERAL) 20 MG tablet Take 1 tablet by mouth 2 (two) times daily. 11/07/16   Historical Provider, MD  sulfamethoxazole-trimethoprim (BACTRIM DS,SEPTRA DS) 800-160 MG tablet Take 1 tablet by mouth 2 (two) times daily. 02/08/17   Glean Hess, MD    Allergies  Allergen Reactions  . Amoxicillin Hives  . Cefdinir Other (See Comments)  . Iodine Solution [Povidone Iodine]     Allergy to cleaning Iodine solution   . Nsaids Other (See Comments)    Tremor intensity  . Tamiflu [Oseltamivir] Hives  . Zithromax [Azithromycin] Hives and Other (See Comments)    Past Surgical History:  Procedure Laterality Date  . APPENDECTOMY  06/2015  . BREAST EXCISIONAL BIOPSY Right   . BREAST EXCISIONAL BIOPSY Left 04/2016   fibroadenoma  . KNEE SURGERY Right 2011  . TONSILLECTOMY Bilateral     Social History  Substance Use Topics  . Smoking status: Never Smoker  . Smokeless tobacco: Never Used  . Alcohol use 1.2 oz/week    2 Standard drinks or equivalent per week     Comment: occassion     Medication list has been reviewed and updated.   Physical Exam  Constitutional: She  appears well-developed and well-nourished.  Cardiovascular: Normal rate, regular rhythm and normal heart sounds.   Pulmonary/Chest: Effort normal and breath sounds normal. No respiratory distress.  Abdominal: Soft. Bowel sounds are normal. There is tenderness in the suprapubic area. There is no rebound, no guarding and no CVA tenderness.  Psychiatric: She has a normal mood and affect.  Nursing note and vitals reviewed.   BP 112/64   Pulse 98   Ht 5\' 2"   (1.575 m)   Wt 131 lb 3.2 oz (59.5 kg)   SpO2 100%   BMI 24.00 kg/m   Assessment and Plan: 1. Acute cystitis with hematuria Continue fluids, may also take AZO - POCT urinalysis dipstick   Meds ordered this encounter  Medications  . sulfamethoxazole-trimethoprim (BACTRIM DS,SEPTRA DS) 800-160 MG tablet    Sig: Take 1 tablet by mouth 2 (two) times daily.    Dispense:  20 tablet    Refill:  0    Halina Maidens, MD [facility]  02/08/2017

## 2017-02-16 ENCOUNTER — Other Ambulatory Visit: Payer: Self-pay | Admitting: Internal Medicine

## 2017-02-17 ENCOUNTER — Other Ambulatory Visit: Payer: Self-pay | Admitting: Obstetrics & Gynecology

## 2017-02-17 DIAGNOSIS — N63 Unspecified lump in unspecified breast: Secondary | ICD-10-CM

## 2017-02-28 ENCOUNTER — Encounter: Payer: 59 | Admitting: Internal Medicine

## 2017-03-20 ENCOUNTER — Ambulatory Visit (INDEPENDENT_AMBULATORY_CARE_PROVIDER_SITE_OTHER): Payer: 59 | Admitting: Internal Medicine

## 2017-03-20 ENCOUNTER — Encounter: Payer: Self-pay | Admitting: Internal Medicine

## 2017-03-20 VITALS — BP 112/60 | HR 76 | Ht 62.0 in | Wt 131.8 lb

## 2017-03-20 DIAGNOSIS — K589 Irritable bowel syndrome without diarrhea: Secondary | ICD-10-CM

## 2017-03-20 DIAGNOSIS — F411 Generalized anxiety disorder: Secondary | ICD-10-CM | POA: Diagnosis not present

## 2017-03-20 DIAGNOSIS — Z0000 Encounter for general adult medical examination without abnormal findings: Secondary | ICD-10-CM

## 2017-03-20 DIAGNOSIS — G250 Essential tremor: Secondary | ICD-10-CM

## 2017-03-20 DIAGNOSIS — J4520 Mild intermittent asthma, uncomplicated: Secondary | ICD-10-CM | POA: Diagnosis not present

## 2017-03-20 LAB — POCT URINALYSIS DIPSTICK
BILIRUBIN UA: NEGATIVE
Glucose, UA: NEGATIVE
Ketones, UA: NEGATIVE
Leukocytes, UA: NEGATIVE
NITRITE UA: NEGATIVE
PH UA: 6 (ref 5.0–8.0)
PROTEIN UA: NEGATIVE
Spec Grav, UA: 1.01 (ref 1.030–1.035)
UROBILINOGEN UA: 0.2 (ref ?–2.0)

## 2017-03-20 NOTE — Progress Notes (Signed)
Date:  03/20/2017   Name:  Nancy Vega   DOB:  1976/09/01   MRN:  [id number]   Chief Complaint: Annual Exam (no pap. no breast exam. ) Nancy Vega is a 41 y.o. female who presents today for her Complete Annual Exam. She feels well. She reports exercising regularly. She reports she is sleeping fairly well. She has a new right breast lump found by GYN - mammogram and [place] pending.  Tremor - doing well on Primidone.  Wants to lower the dose.  Will discuss with Neurology.  Migraine   This is a chronic problem. The problem occurs seasonly. The pain quality is similar to prior headaches. Pertinent negatives include no abdominal pain, coughing, dizziness, fever, hearing loss, tinnitus or vomiting. Treatments tried: pinna piercing has helped tremendously.  Anxiety  Presents for follow-up visit. Patient reports no chest pain, dizziness, nervous/anxious behavior, palpitations or shortness of breath. Symptoms occur occasionally. The quality of sleep is good.   Her past medical history is significant for asthma. Compliance with medications is 76-100% (able to cut Wellbutrin back to 300 mg per day).  Asthma  There is no cough, shortness of breath or wheezing. Associated symptoms include headaches. Pertinent negatives include no chest pain, fever or trouble swallowing. Her symptoms are aggravated by URI. Her symptoms are alleviated by leukotriene antagonist. She reports complete improvement on treatment. Her past medical history is significant for asthma.     Review of Systems  Constitutional: Negative for chills, fatigue and fever.  HENT: Negative for congestion, hearing loss, tinnitus, trouble swallowing and voice change.   Eyes: Negative for visual disturbance.  Respiratory: Negative for cough, chest tightness, shortness of breath and wheezing.   Cardiovascular: Negative for chest pain, palpitations and leg swelling.  Gastrointestinal: Negative for abdominal pain, constipation,  diarrhea and vomiting.  Endocrine: Negative for polydipsia and polyuria.  Genitourinary: Negative for dysuria, frequency, genital sores, vaginal bleeding and vaginal discharge.       Has a new breast lump - mammogram and [place] pending  Musculoskeletal: Negative for arthralgias, gait problem and joint swelling.  Skin: Negative for color change and rash.  Neurological: Positive for headaches. Negative for dizziness, tremors and light-headedness.  Hematological: Negative for adenopathy. Does not bruise/bleed easily.  Psychiatric/Behavioral: Negative for dysphoric mood and sleep disturbance. The patient is not nervous/anxious.     Patient Active Problem List   Diagnosis Date Noted  . Dense breast tissue 03/21/2016  . Anxiety disorder 07/13/2015  . Chronic bronchitis, simple ([place]) 07/13/2015  . Essential tremor 07/13/2015  . IBS (irritable bowel syndrome) 07/13/2015    Prior to Admission medications   Medication Sig Start Date End Date Taking? Authorizing Provider  ALPRAZolam Duanne Moron) 0.5 MG tablet Take 1 tablet by mouth 2 (two) times daily as needed.   Yes Historical Provider, MD  buPROPion (WELLBUTRIN XL) 300 MG 24 hr tablet TAKE ONE TABLET EVERY MORNING Patient taking differently: twice daily 12/21/16  Yes Glean Hess, MD  levonorgestrel (MIRENA) 20 MCG/24HR IUD 1 each by Intrauterine route once.   Yes Historical Provider, MD  mometasone-formoterol (DULERA) 100-5 MCG/ACT AERO Inhale 2 puffs into the lungs 2 (two) times daily.   Yes Historical Provider, MD  montelukast (SINGULAIR) 10 MG tablet TAKE ONE (1) TABLET BY MOUTH ONCE DAILY 06/16/16  Yes Glean Hess, MD  primidone (MYSOLINE) 50 MG tablet Take by mouth 2 (two) times daily.   Yes Historical Provider, MD    Allergies  Allergen Reactions  .  Amoxicillin Hives  . Cefdinir Other (See Comments)  . Iodine Solution [Povidone Iodine]     Allergy to cleaning Iodine solution   . Nsaids Other (See Comments)    Tremor intensity  .  Tamiflu [Oseltamivir] Hives  . Zithromax [Azithromycin] Hives and Other (See Comments)    Past Surgical History:  Procedure Laterality Date  . APPENDECTOMY  06/2015  . BREAST EXCISIONAL BIOPSY Right   . BREAST EXCISIONAL BIOPSY Left 04/2016   fibroadenoma  . KNEE SURGERY Right 2011  . TONSILLECTOMY Bilateral     Social History  Substance Use Topics  . Smoking status: Never Smoker  . Smokeless tobacco: Never Used  . Alcohol use 1.2 oz/week    2 Standard drinks or equivalent per week     Comment: occassion     Medication list has been reviewed and updated.   Physical Exam  Constitutional: She is oriented to person, place, and time. She appears well-developed and well-nourished. No distress.  HENT:  Head: Normocephalic and atraumatic.  Right Ear: Tympanic membrane and ear canal normal.  Left Ear: Tympanic membrane and ear canal normal.  Nose: Right sinus exhibits no maxillary sinus tenderness. Left sinus exhibits no maxillary sinus tenderness.  Mouth/Throat: Uvula is midline and oropharynx is clear and moist.  Eyes: Conjunctivae and EOM are normal. Right eye exhibits no discharge. Left eye exhibits no discharge. No scleral icterus.  Neck: Normal range of motion. Carotid bruit is not present. No erythema present. No thyromegaly present.  Cardiovascular: Normal rate, regular rhythm, normal heart sounds and normal pulses.   Pulmonary/Chest: Effort normal. No respiratory distress. She has no wheezes.  Abdominal: Soft. Bowel sounds are normal. There is no hepatosplenomegaly. There is no tenderness. There is no CVA tenderness.  Musculoskeletal: She exhibits no edema or tenderness.  Lymphadenopathy:    She has no cervical adenopathy.    She has no axillary adenopathy.  Neurological: She is alert and oriented to person, place, and time. She has normal reflexes. No cranial nerve deficit or sensory deficit.  Skin: Skin is warm, dry and intact. No rash noted.  Psychiatric: She has a  normal mood and affect. Her speech is normal and behavior is normal. Thought content normal.  Nursing note and vitals reviewed.   BP 112/60 (BP Location: Right Arm, Patient Position: Sitting, Cuff Size: Normal)   Pulse 76   Ht 5\' 2"  (1.575 m)   Wt 131 lb 12.8 oz (59.8 kg)   SpO2 100%   BMI 24.11 kg/m   Assessment and Plan: 1. Annual physical exam Work on diet and exercise - Lipid panel - POCT urinalysis dipstick  2. Irritable bowel syndrome, unspecified type stable - CBC with Differential/Platelet  3. Essential tremor Controlled; followed by Neurology on Primadone - Comprehensive metabolic panel  4. Generalized anxiety disorder Doing well on Bupropion at lower dose - TSH  5. Asthma in adult, mild intermittent, uncomplicated Stable with no recent exacerbations Continue singulair   No orders of the defined types were placed in this encounter.   Halina Maidens, MD [facility]  03/20/2017

## 2017-03-21 LAB — CBC WITH DIFFERENTIAL/PLATELET
Basophils Absolute: 0 10*3/uL (ref 0.0–0.2)
Basos: 0 %
EOS (ABSOLUTE): 0.1 10*3/uL (ref 0.0–0.4)
Eos: 1 %
Hematocrit: 38.4 % (ref 34.0–46.6)
Hemoglobin: 13.2 g/dL (ref 11.1–15.9)
Immature Grans (Abs): 0 10*3/uL (ref 0.0–0.1)
Immature Granulocytes: 0 %
Lymphocytes Absolute: 1.8 10*3/uL (ref 0.7–3.1)
Lymphs: 27 %
MCH: 32.6 pg (ref 26.6–33.0)
MCHC: 34.4 g/dL (ref 31.5–35.7)
MCV: 95 fL (ref 79–97)
Monocytes Absolute: 0.5 10*3/uL (ref 0.1–0.9)
Monocytes: 8 %
Neutrophils Absolute: 4.3 10*3/uL (ref 1.4–7.0)
Neutrophils: 64 %
Platelets: 276 10*3/uL (ref 150–379)
RBC: 4.05 x10E6/uL (ref 3.77–5.28)
RDW: 13.9 % (ref 12.3–15.4)
WBC: 6.7 10*3/uL (ref 3.4–10.8)

## 2017-03-21 LAB — COMPREHENSIVE METABOLIC PANEL WITH GFR
ALT: 18 [IU]/L (ref 0–32)
AST: 19 [IU]/L (ref 0–40)
Albumin/Globulin Ratio: 1.9 (ref 1.2–2.2)
Albumin: 4.5 g/dL (ref 3.5–5.5)
Alkaline Phosphatase: 80 [IU]/L (ref 39–117)
BUN/Creatinine Ratio: 18 (ref 9–23)
BUN: 14 mg/dL (ref 6–24)
Bilirubin Total: 1.1 mg/dL (ref 0.0–1.2)
CO2: 26 mmol/L (ref 18–29)
Calcium: 9.6 mg/dL (ref 8.7–10.2)
Chloride: 98 mmol/L (ref 96–106)
Creatinine, Ser: 0.77 mg/dL (ref 0.57–1.00)
GFR calc Af Amer: 112 mL/min/{1.73_m2}
GFR calc non Af Amer: 97 mL/min/{1.73_m2}
Globulin, Total: 2.4 g/dL (ref 1.5–4.5)
Glucose: 82 mg/dL (ref 65–99)
Potassium: 4.3 mmol/L (ref 3.5–5.2)
Sodium: 139 mmol/L (ref 134–144)
Total Protein: 6.9 g/dL (ref 6.0–8.5)

## 2017-03-21 LAB — TSH: TSH: 2.12 u[IU]/mL (ref 0.450–4.500)

## 2017-03-21 LAB — LIPID PANEL
Chol/HDL Ratio: 2.7 ratio (ref 0.0–4.4)
Cholesterol, Total: 192 mg/dL (ref 100–199)
HDL: 70 mg/dL
LDL Calculated: 107 mg/dL — ABNORMAL HIGH (ref 0–99)
Triglycerides: 77 mg/dL (ref 0–149)
VLDL Cholesterol Cal: 15 mg/dL (ref 5–40)

## 2017-03-27 ENCOUNTER — Other Ambulatory Visit: Payer: 59

## 2017-03-31 ENCOUNTER — Ambulatory Visit
Admission: RE | Admit: 2017-03-31 | Discharge: 2017-03-31 | Disposition: A | Discharge status: Home / Self Care | Payer: 59 | Source: Ambulatory Visit | Attending: Obstetrics & Gynecology | Admitting: Obstetrics & Gynecology

## 2017-03-31 DIAGNOSIS — N63 Unspecified lump in unspecified breast: Secondary | ICD-10-CM

## 2017-03-31 DIAGNOSIS — N6312 Unspecified lump in the right breast, upper inner quadrant: Secondary | ICD-10-CM | POA: Insufficient documentation

## 2017-04-12 ENCOUNTER — Encounter: Payer: 59 | Admitting: Internal Medicine

## 2017-06-13 ENCOUNTER — Other Ambulatory Visit: Payer: Self-pay | Admitting: Obstetrics & Gynecology

## 2017-06-13 DIAGNOSIS — Z1231 Encounter for screening mammogram for malignant neoplasm of breast: Secondary | ICD-10-CM

## 2017-07-10 ENCOUNTER — Other Ambulatory Visit: Payer: Self-pay | Admitting: Internal Medicine

## 2017-08-15 ENCOUNTER — Other Ambulatory Visit: Payer: Self-pay | Admitting: Unknown Physician Specialty

## 2017-08-15 DIAGNOSIS — M25562 Pain in left knee: Secondary | ICD-10-CM

## 2017-08-23 ENCOUNTER — Ambulatory Visit
Admission: RE | Admit: 2017-08-23 | Discharge: 2017-08-23 | Disposition: A | Discharge status: Home / Self Care | Payer: BLUE CROSS/BLUE SHIELD | Source: Ambulatory Visit | Attending: Unknown Physician Specialty | Admitting: Unknown Physician Specialty

## 2017-08-23 DIAGNOSIS — M25562 Pain in left knee: Secondary | ICD-10-CM | POA: Diagnosis present

## 2017-08-23 DIAGNOSIS — M7122 Synovial cyst of popliteal space [Baker], left knee: Secondary | ICD-10-CM | POA: Diagnosis not present

## 2017-08-23 DIAGNOSIS — M2410 Other articular cartilage disorders, unspecified site: Secondary | ICD-10-CM | POA: Insufficient documentation

## 2017-09-08 ENCOUNTER — Encounter: Payer: Self-pay | Admitting: Internal Medicine

## 2017-09-08 ENCOUNTER — Ambulatory Visit (INDEPENDENT_AMBULATORY_CARE_PROVIDER_SITE_OTHER): Payer: 59 | Admitting: Internal Medicine

## 2017-09-08 VITALS — BP 122/68 | HR 110 | Temp 98.8°F | Ht 62.0 in | Wt 138.0 lb

## 2017-09-08 DIAGNOSIS — J40 Bronchitis, not specified as acute or chronic: Secondary | ICD-10-CM

## 2017-09-08 DIAGNOSIS — J4520 Mild intermittent asthma, uncomplicated: Secondary | ICD-10-CM

## 2017-09-08 MED ORDER — ALBUTEROL SULFATE HFA 108 (90 BASE) MCG/ACT IN AERS: 2.0000 | INHALATION_SPRAY | Freq: Four times a day (QID) | RESPIRATORY_TRACT | 0 refills | Status: DC | PRN

## 2017-09-08 MED ORDER — LEVOFLOXACIN 500 MG PO TABS: 500.0000 mg | ORAL_TABLET | Freq: Every day | ORAL | 0 refills | Status: DC

## 2017-09-08 MED ORDER — BENZONATATE 100 MG PO CAPS: 100.0000 mg | ORAL_CAPSULE | Freq: Three times a day (TID) | ORAL | 0 refills | Status: DC

## 2017-09-08 NOTE — Progress Notes (Signed)
Date:  09/08/2017   Name:  Nancy Vega   DOB:  01/05/1976   MRN:  [id number]   Chief Complaint: Cough (X 2 weeks. Dry cough with no other symptoms. Not going away. No fever. )  Cough  This is a new problem. The current episode started 1 to 4 weeks ago. The problem has been unchanged. The problem occurs every few minutes. The cough is non-productive. Associated symptoms include wheezing. Pertinent negatives include no chest pain, chills or fever.  She continues to use Singulair. She does not have an albuterol inhaler. She has not been using Dulera as she did not derive any benefit.   Review of Systems  Constitutional: Negative for chills, fatigue and fever.  Respiratory: Positive for cough and wheezing. Negative for chest tightness.   Cardiovascular: Negative for chest pain.    Patient Active Problem List   Diagnosis Date Noted  . Dense breast tissue 03/21/2016  . Anxiety disorder 07/13/2015  . Asthma in adult, mild intermittent, uncomplicated 09/62/8366  . Essential tremor 07/13/2015  . IBS (irritable bowel syndrome) 07/13/2015    Prior to Admission medications   Medication Sig Start Date End Date Taking? Authorizing Provider  ALPRAZolam Duanne Moron) 0.5 MG tablet Take 1 tablet by mouth 2 (two) times daily as needed.   Yes [provider]  buPROPion (WELLBUTRIN SR) 150 MG 12 hr tablet Take 150 mg by mouth daily.   Yes [provider]  buPROPion (WELLBUTRIN XL) 300 MG 24 hr tablet TAKE ONE TABLET EVERY MORNING Patient taking differently: daily 12/21/16  Yes Glean Hess, MD  levonorgestrel (MIRENA) 20 MCG/24HR IUD 1 each by Intrauterine route once.   Yes [provider]  mometasone-formoterol (DULERA) 100-5 MCG/ACT AERO Inhale 2 puffs into the lungs 2 (two) times daily.   Yes [provider]  montelukast (SINGULAIR) 10 MG tablet TAKE (1) TABLET BY MOUTH EVERY DAY 07/10/17  Yes Glean Hess, MD  topiramate (TOPAMAX) 25 MG tablet  Take 25 mg by mouth 2 (two) times daily. Take 2 tabs in the morning and 2 tabs at night.   Yes [provider]    Allergies  Allergen Reactions  . Amoxicillin Hives  . Cefdinir Other (See Comments)  . Iodine Solution [Povidone Iodine]     Allergy to cleaning Iodine solution   . Nsaids Other (See Comments)    Tremor intensity  . Tamiflu [Oseltamivir] Hives  . Zithromax [Azithromycin] Hives and Other (See Comments)    Past Surgical History:  Procedure Laterality Date  . APPENDECTOMY  06/2015  . BREAST EXCISIONAL BIOPSY Right   . BREAST EXCISIONAL BIOPSY Left 04/2016   fibroadenoma  . KNEE SURGERY Right 2011  . TONSILLECTOMY Bilateral     Social History  Substance Use Topics  . Smoking status: Never Smoker  . Smokeless tobacco: Never Used  . Alcohol use 1.2 oz/week    2 Standard drinks or equivalent per week     Comment: occassion     Medication list has been reviewed and updated.  PHQ 2/9 Scores 09/08/2017  PHQ - 2 Score 0    Physical Exam  Constitutional: She is oriented to person, place, and time. She appears well-developed. No distress.  HENT:  Head: Normocephalic and atraumatic.  Right Ear: Tympanic membrane and ear canal normal.  Left Ear: Tympanic membrane and ear canal normal.  Nose: Right sinus exhibits no maxillary sinus tenderness. Left sinus exhibits no maxillary sinus tenderness.  Mouth/Throat: No posterior oropharyngeal  edema or posterior oropharyngeal erythema.  Cardiovascular: Normal rate, regular rhythm and normal heart sounds.   Pulmonary/Chest: Effort normal. No respiratory distress. She has wheezes in the right upper field and the left upper field. She has no rhonchi.  Musculoskeletal: Normal range of motion.  Neurological: She is alert and oriented to person, place, and time.  Skin: Skin is warm and dry. No rash noted.  Psychiatric: She has a normal mood and affect. Her behavior is normal. Thought content normal.  Nursing note and vitals  reviewed.   BP 122/68   Pulse (!) 110   Temp 98.8 F (37.1 C) (Oral)   Ht 5\' 2"  (1.575 m)   Wt 138 lb (62.6 kg)   SpO2 100%   BMI 25.24 kg/m   Assessment and Plan: 1. Bronchitis Continue singulair Sample of Dulera 100/5 given - levofloxacin (LEVAQUIN) 500 MG tablet; Take 1 tablet (500 mg total) by mouth daily.  Dispense: 7 tablet; Refill: 0 - albuterol (PROVENTIL HFA;VENTOLIN HFA) 108 (90 Base) MCG/ACT inhaler; Inhale 2 puffs into the lungs every 6 (six) hours as needed for wheezing or shortness of breath.  Dispense: 1 Inhaler; Refill: 0 - benzonatate (TESSALON) 100 MG capsule; Take 1 capsule (100 mg total) by mouth 3 (three) times daily.  Dispense: 20 capsule; Refill: 0   Meds ordered this encounter  Medications  . levofloxacin (LEVAQUIN) 500 MG tablet    Sig: Take 1 tablet (500 mg total) by mouth daily.    Dispense:  7 tablet    Refill:  0  . albuterol (PROVENTIL HFA;VENTOLIN HFA) 108 (90 Base) MCG/ACT inhaler    Sig: Inhale 2 puffs into the lungs every 6 (six) hours as needed for wheezing or shortness of breath.    Dispense:  1 Inhaler    Refill:  0  . benzonatate (TESSALON) 100 MG capsule    Sig: Take 1 capsule (100 mg total) by mouth 3 (three) times daily.    Dispense:  20 capsule    Refill:  0    Partially dictated using Editor, commissioning. Any errors are unintentional.  Halina Maidens, MD [facility]  09/08/2017

## 2017-09-22 ENCOUNTER — Ambulatory Visit: Payer: 59 | Admitting: Internal Medicine

## 2017-09-22 ENCOUNTER — Telehealth: Payer: Self-pay

## 2017-09-22 ENCOUNTER — Other Ambulatory Visit: Payer: Self-pay | Admitting: Internal Medicine

## 2017-09-22 DIAGNOSIS — J40 Bronchitis, not specified as acute or chronic: Secondary | ICD-10-CM

## 2017-09-22 MED ORDER — BENZONATATE 200 MG PO CAPS: 200.0000 mg | ORAL_CAPSULE | Freq: Three times a day (TID) | ORAL | 0 refills | Status: AC

## 2017-09-22 MED ORDER — DOXYCYCLINE HYCLATE 100 MG PO TABS: 100.0000 mg | ORAL_TABLET | Freq: Two times a day (BID) | ORAL | 0 refills | Status: DC

## 2017-09-22 NOTE — Telephone Encounter (Signed)
She should probably try doxycycline if she can tolerate it.  Let me know.

## 2017-09-22 NOTE — Telephone Encounter (Signed)
Left patient VM - awaiting call back.

## 2017-09-22 NOTE — Telephone Encounter (Signed)
Patient would like to try doxycycline for continuous bronchitis. Wants sent to [facility] in [place]. Please Advise.

## 2017-09-22 NOTE — Telephone Encounter (Signed)
Patient called stating she is still having a bad cough from visit a few weeks ago. Completed the antibiotics 1 week ago but not going away.   Please Advise.

## 2017-09-25 ENCOUNTER — Encounter: Payer: Self-pay | Admitting: Internal Medicine

## 2017-09-25 ENCOUNTER — Ambulatory Visit (INDEPENDENT_AMBULATORY_CARE_PROVIDER_SITE_OTHER): Payer: 59 | Admitting: Internal Medicine

## 2017-09-25 VITALS — BP 128/70 | HR 84 | Temp 99.0°F | Ht 62.0 in | Wt 137.0 lb

## 2017-09-25 DIAGNOSIS — J40 Bronchitis, not specified as acute or chronic: Secondary | ICD-10-CM

## 2017-09-25 DIAGNOSIS — J4520 Mild intermittent asthma, uncomplicated: Secondary | ICD-10-CM | POA: Diagnosis not present

## 2017-09-25 MED ORDER — PREDNISONE 10 MG PO TABS: ORAL_TABLET | ORAL | 0 refills | Status: DC

## 2017-09-25 MED ORDER — LEVOFLOXACIN 500 MG PO TABS: 500.0000 mg | ORAL_TABLET | Freq: Every day | ORAL | 0 refills | Status: DC

## 2017-09-25 NOTE — Progress Notes (Signed)
Date:  09/25/2017   Name:  Nancy Vega   DOB:  [birth year]   MRN:  [id number]   Chief Complaint: Cough (Cough is not getting anybetter. Took doxycycline and made her sick to stomach. Unable to take. Cough has gotten worse. )  Cough  This is a recurrent problem. The current episode started 1 to 4 weeks ago. The problem has been waxing and waning. The problem occurs every few minutes. The cough is non-productive. Associated symptoms include a fever (low grade) and wheezing. Pertinent negatives include no chest pain, chills, ear congestion, nasal congestion, shortness of breath or weight loss. The symptoms are aggravated by exercise. Her past medical history is significant for bronchitis.  She completed a course of Levaquin. She then began a course of doxycycline which causes nausea and vomiting and elevated able to take several doses. She continues to use her albuterol inhaler with good relief. She continues on Singulair.   Review of Systems  Constitutional: Positive for fever (low grade). Negative for chills and weight loss.  Eyes: Negative for visual disturbance.  Respiratory: Positive for cough and wheezing. Negative for shortness of breath.   Cardiovascular: Negative for chest pain and palpitations.    Patient Active Problem List   Diagnosis Date Noted  . Dense breast tissue 03/21/2016  . Anxiety disorder 07/13/2015  . Asthma in adult, mild intermittent, uncomplicated 79/89/2119  . Essential tremor 07/13/2015  . IBS (irritable bowel syndrome) 07/13/2015    Prior to Admission medications   Medication Sig Start Date End Date Taking? Authorizing Provider  albuterol (PROVENTIL HFA;VENTOLIN HFA) 108 (90 Base) MCG/ACT inhaler Inhale 2 puffs into the lungs every 6 (six) hours as needed for wheezing or shortness of breath. 09/08/17  Yes Glean Hess, MD  ALPRAZolam Duanne Moron) 0.5 MG tablet Take 1 tablet by mouth 2 (two) times daily as needed.   Yes [provider]    benzonatate (TESSALON) 200 MG capsule Take 1 capsule (200 mg total) by mouth 3 (three) times daily. 09/22/17 10/02/17 Yes Glean Hess, MD  buPROPion [facility] SR) 150 MG 12 hr tablet Take 150 mg by mouth daily.   Yes [provider]  buPROPion (WELLBUTRIN XL) 300 MG 24 hr tablet TAKE ONE TABLET EVERY MORNING Patient taking differently: daily 12/21/16  Yes Glean Hess, MD  levonorgestrel (MIRENA) 20 MCG/24HR IUD 1 each by Intrauterine route once.   Yes [provider]  montelukast (SINGULAIR) 10 MG tablet TAKE (1) TABLET BY MOUTH EVERY DAY 07/10/17  Yes Glean Hess, MD  topiramate (TOPAMAX) 25 MG tablet Take 25 mg by mouth 2 (two) times daily. Take 2 tabs in the morning and 2 tabs at night.   Yes [provider]    Allergies  Allergen Reactions  . Doxycycline Nausea And Vomiting  . Amoxicillin Hives  . Cefdinir Other (See Comments)  . Iodine Solution [Povidone Iodine]     Allergy to cleaning Iodine solution   . Nsaids Other (See Comments)    Tremor intensity  . Tamiflu [Oseltamivir] Hives  . Zithromax [Azithromycin] Hives and Other (See Comments)    Past Surgical History:  Procedure Laterality Date  . APPENDECTOMY  06/2015  . BREAST EXCISIONAL BIOPSY Right   . BREAST EXCISIONAL BIOPSY Left 04/2016   fibroadenoma  . KNEE SURGERY Right 2011  . TONSILLECTOMY Bilateral     Social History  Substance Use Topics  . Smoking status: Never Smoker  . Smokeless tobacco: Never Used  .  Alcohol use 1.2 oz/week    2 Standard drinks or equivalent per week     Comment: occassion     Medication list has been reviewed and updated.  PHQ 2/9 Scores 09/08/2017  PHQ - 2 Score 0    Physical Exam  Constitutional: She is oriented to person, place, and time. She appears well-developed. No distress.  HENT:  Head: Normocephalic and atraumatic.  Cardiovascular: Normal rate, regular rhythm and normal heart sounds.   Pulmonary/Chest: Effort normal. No  accessory muscle usage. No respiratory distress. She has no decreased breath sounds. She has wheezes (few inspiratory wheezes) in the right upper field and the left upper field. She has no rhonchi. She has no rales.  Musculoskeletal: Normal range of motion.  Neurological: She is alert and oriented to person, place, and time.  Skin: Skin is warm and dry. No rash noted.  Psychiatric: She has a normal mood and affect. Her behavior is normal. Thought content normal.  Nursing note and vitals reviewed.   BP 128/70 (BP Location: Right Arm, Patient Position: Sitting, Cuff Size: Normal)   Pulse 84   Temp 99 F (37.2 C) (Oral)   Ht 5\' 2"  (1.575 m)   Wt 137 lb (62.1 kg)   SpO2 99%   BMI 25.06 kg/m   Assessment and Plan: 1. Bronchitis Persistent sx -  - levofloxacin (LEVAQUIN) 500 MG tablet; Take 1 tablet (500 mg total) by mouth daily.  Dispense: 7 tablet; Refill: 0  2. Asthma in adult, mild intermittent, uncomplicated - predniSONE (DELTASONE) 10 MG tablet; Take 6 on day 1, 5 on day 2, 4 on day 3, 3 on day 4, 2 on day 5 and 1 on day 1 then stop.  Dispense: 21 tablet; Refill: 0   Meds ordered this encounter  Medications  . predniSONE (DELTASONE) 10 MG tablet    Sig: Take 6 on day 1, 5 on day 2, 4 on day 3, 3 on day 4, 2 on day 5 and 1 on day 1 then stop.    Dispense:  21 tablet    Refill:  0  . levofloxacin (LEVAQUIN) 500 MG tablet    Sig: Take 1 tablet (500 mg total) by mouth daily.    Dispense:  7 tablet    Refill:  0    Partially dictated using Editor, commissioning. Any errors are unintentional.  Halina Maidens, MD [facility]  09/25/2017

## 2017-09-29 ENCOUNTER — Telehealth: Payer: Self-pay

## 2017-09-29 NOTE — Telephone Encounter (Signed)
Patient husband called stating still not better but also no worse with bronchitis. Informed husband she will need to continue finishing antibiotics. Also, make sure she is using her once a day inhaler for breo. Gave another sample to front desk for patient to pick up BREO inhaler 128mcg/25mcg. He verbalized understanding. If symptoms do not improve after antibiotics call the office.

## 2017-10-04 ENCOUNTER — Telehealth: Payer: Self-pay

## 2017-10-04 NOTE — Telephone Encounter (Signed)
The cough may linger for several weeks but should slowly improve.  The loss of voice is likely due to vocal cord irritation from both cough and speaking.  It should gradually improve as well.

## 2017-10-04 NOTE — Telephone Encounter (Signed)
Patient wanted to call and give update for sickness. States she is feeling better. Still coughing and losing her breathe. Also, complains of losing her voice by the end of each day.

## 2017-10-04 NOTE — Telephone Encounter (Signed)
Called and left VM informing of Dr Oneal Deputy Exact note.

## 2017-10-07 ENCOUNTER — Other Ambulatory Visit: Payer: Self-pay | Admitting: Internal Medicine

## 2017-10-10 ENCOUNTER — Other Ambulatory Visit: Payer: Self-pay | Admitting: Internal Medicine

## 2017-10-10 ENCOUNTER — Telehealth: Payer: Self-pay

## 2017-10-10 MED ORDER — BUPROPION HCL ER (XL) 300 MG PO TB24: 300.0000 mg | ORAL_TABLET | Freq: Every day | ORAL | 3 refills | Status: DC

## 2017-10-10 NOTE — Telephone Encounter (Signed)
Patient husband called to get refill for wife- needs refill on Bupropion sent to [facility]. Please Advise.

## 2018-03-19 ENCOUNTER — Ambulatory Visit
Admission: RE | Admit: 2018-03-19 | Discharge: 2018-03-19 | Disposition: A | Discharge status: Home / Self Care | Payer: BLUE CROSS/BLUE SHIELD | Source: Ambulatory Visit | Attending: Obstetrics & Gynecology | Admitting: Obstetrics & Gynecology

## 2018-03-19 ENCOUNTER — Other Ambulatory Visit: Payer: Self-pay | Admitting: Obstetrics & Gynecology

## 2018-03-19 DIAGNOSIS — Z1231 Encounter for screening mammogram for malignant neoplasm of breast: Secondary | ICD-10-CM | POA: Diagnosis present

## 2018-04-02 ENCOUNTER — Ambulatory Visit: Payer: 59

## 2018-04-02 ENCOUNTER — Other Ambulatory Visit: Payer: Self-pay | Admitting: Internal Medicine

## 2018-04-02 DIAGNOSIS — J40 Bronchitis, not specified as acute or chronic: Secondary | ICD-10-CM

## 2018-07-18 ENCOUNTER — Ambulatory Visit (INDEPENDENT_AMBULATORY_CARE_PROVIDER_SITE_OTHER): Payer: No Typology Code available for payment source | Admitting: Internal Medicine

## 2018-07-18 ENCOUNTER — Encounter: Payer: Self-pay | Admitting: Internal Medicine

## 2018-07-18 VITALS — BP 100/66 | HR 83 | Ht 62.0 in | Wt 138.0 lb

## 2018-07-18 DIAGNOSIS — K589 Irritable bowel syndrome without diarrhea: Secondary | ICD-10-CM

## 2018-07-18 DIAGNOSIS — J4520 Mild intermittent asthma, uncomplicated: Secondary | ICD-10-CM

## 2018-07-18 DIAGNOSIS — Z0000 Encounter for general adult medical examination without abnormal findings: Secondary | ICD-10-CM

## 2018-07-18 DIAGNOSIS — F411 Generalized anxiety disorder: Secondary | ICD-10-CM | POA: Diagnosis not present

## 2018-07-18 DIAGNOSIS — J410 Simple chronic bronchitis: Secondary | ICD-10-CM | POA: Diagnosis not present

## 2018-07-18 LAB — POCT URINALYSIS DIPSTICK
BILIRUBIN UA: NEGATIVE
Blood, UA: NEGATIVE
Glucose, UA: NEGATIVE
KETONES UA: NEGATIVE
NITRITE UA: NEGATIVE
PH UA: 7 (ref 5.0–8.0)
PROTEIN UA: NEGATIVE
Spec Grav, UA: 1.01 (ref 1.010–1.025)
UROBILINOGEN UA: 0.2 U/dL

## 2018-07-18 NOTE — Progress Notes (Signed)
Date:  07/18/2018   Name:  Nancy Vega   DOB:  01-22-1976   MRN:  [id number]   Chief Complaint: Annual Exam Nancy Vega is a 42 y.o. female who presents today for her Complete Annual Exam. She feels well. She reports exercising regularly. She reports she is sleeping well.  Followed by GYN in [place] - last pap Dec 2018. Mammogram done 03/2018.  Currently working as a Surveyor, minerals and enjoying it.  Her older daughter is going off the [employer] this fall.  Anxiety  Presents for follow-up visit. Patient reports no chest pain, dizziness, excessive worry, insomnia, nervous/anxious behavior, palpitations or shortness of breath. Symptoms occur rarely. The quality of sleep is good.    Constipation  This is a chronic problem. Her stool frequency is 1 time per week or less. The stool is described as formed. The patient is on a high fiber diet. She exercises regularly. There has been adequate water intake. Pertinent negatives include no abdominal pain, diarrhea, fever or vomiting.   Chronic bronchitis - stable with no recent chest sx or infections.  Using singulair daily.  Exercising daily with good endurance.  Review of Systems  Constitutional: Negative for chills, fatigue and fever.  HENT: Negative for congestion, hearing loss, tinnitus, trouble swallowing and voice change.   Eyes: Negative for visual disturbance.  Respiratory: Negative for cough, chest tightness, shortness of breath and wheezing.   Cardiovascular: Negative for chest pain, palpitations and leg swelling.  Gastrointestinal: Positive for constipation. Negative for abdominal pain, diarrhea and vomiting.  Endocrine: Negative for polydipsia and polyuria.  Genitourinary: Negative for dysuria, frequency, genital sores, vaginal bleeding and vaginal discharge.  Musculoskeletal: Negative for arthralgias, gait problem and joint swelling.  Skin: Negative for color change and rash.  Neurological: Negative for dizziness, tremors,  light-headedness and headaches.  Hematological: Negative for adenopathy. Does not bruise/bleed easily.  Psychiatric/Behavioral: Negative for dysphoric mood and sleep disturbance. The patient is not nervous/anxious and does not have insomnia.     Patient Active Problem List   Diagnosis Date Noted  . Dense breast tissue 03/21/2016  . Anxiety disorder 07/13/2015  . Asthma in adult, mild intermittent, uncomplicated 75/09/2584  . Essential tremor 07/13/2015  . IBS (irritable bowel syndrome) 07/13/2015    Prior to Admission medications   Medication Sig Start Date End Date Taking? Authorizing Provider  albuterol (PROVENTIL HFA;VENTOLIN HFA) 108 (90 Base) MCG/ACT inhaler Inhale 2 puffs into the lungs every 6 (six) hours as needed for wheezing or shortness of breath. 09/08/17  Yes Glean Hess, MD  ALPRAZolam Duanne Moron) 0.5 MG tablet Take 1 tablet by mouth 2 (two) times daily as needed.   Yes [provider]  buPROPion (WELLBUTRIN XL) 300 MG 24 hr tablet Take 1 tablet (300 mg total) by mouth daily. 10/10/17  Yes Glean Hess, MD  levonorgestrel (MIRENA) 20 MCG/24HR IUD 1 each by Intrauterine route once.   Yes [provider]  montelukast (SINGULAIR) 10 MG tablet TAKE (1) TABLET BY MOUTH EVERY DAY 07/10/17  Yes Glean Hess, MD  primidone (MYSOLINE) 50 MG tablet Take 75 mg by mouth 2 (two) times daily.   Yes [provider]    Allergies  Allergen Reactions  . Doxycycline Nausea And Vomiting  . Amoxicillin Hives  . Cefdinir Other (See Comments)  . Iodine Solution [Povidone Iodine]     Allergy to cleaning Iodine solution   . Nsaids Other (See Comments)    Tremor intensity  . Tamiflu [  Oseltamivir] Hives  . Zithromax [Azithromycin] Hives and Other (See Comments)    Past Surgical History:  Procedure Laterality Date  . APPENDECTOMY  06/2015  . BREAST EXCISIONAL BIOPSY Right 2016   neg  . BREAST EXCISIONAL BIOPSY Left 04/2016   fibroadenoma/ us/bx - clip-    . BREAST LUMPECTOMY Right 2012   neg  . KNEE SURGERY Right 2011  . TONSILLECTOMY Bilateral     Social History   Tobacco Use  . Smoking status: Never Smoker  . Smokeless tobacco: Never Used  Substance Use Topics  . Alcohol use: Yes    Alcohol/week: 1.2 oz    Types: 2 Standard drinks or equivalent per week    Comment: occassion  . Drug use: No     Medication list has been reviewed and updated.  Current Meds  Medication Sig  . albuterol (PROVENTIL HFA;VENTOLIN HFA) 108 (90 Base) MCG/ACT inhaler Inhale 2 puffs into the lungs every 6 (six) hours as needed for wheezing or shortness of breath.  . ALPRAZolam (XANAX) 0.5 MG tablet Take 1 tablet by mouth 2 (two) times daily as needed.  Marland Kitchen buPROPion (WELLBUTRIN XL) 300 MG 24 hr tablet Take 1 tablet (300 mg total) by mouth daily.  Marland Kitchen levonorgestrel (MIRENA) 20 MCG/24HR IUD 1 each by Intrauterine route once.  . montelukast (SINGULAIR) 10 MG tablet TAKE (1) TABLET BY MOUTH EVERY DAY  . primidone (MYSOLINE) 50 MG tablet Take 75 mg by mouth 2 (two) times daily.    PHQ 2/9 Scores 09/08/2017  PHQ - 2 Score 0    Physical Exam  Constitutional: She is oriented to person, place, and time. She appears well-developed and well-nourished. No distress.  HENT:  Head: Normocephalic and atraumatic.  Right Ear: Tympanic membrane and ear canal normal.  Left Ear: Tympanic membrane and ear canal normal.  Nose: Right sinus exhibits no maxillary sinus tenderness. Left sinus exhibits no maxillary sinus tenderness.  Mouth/Throat: Uvula is midline and oropharynx is clear and moist.  Eyes: Conjunctivae and EOM are normal. Right eye exhibits no discharge. Left eye exhibits no discharge. No scleral icterus.  Neck: Normal range of motion. Carotid bruit is not present. No erythema present. No thyromegaly present.  Cardiovascular: Normal rate, regular rhythm, normal heart sounds and normal pulses.  Pulmonary/Chest: Effort normal. No respiratory distress. She has no  wheezes.  Abdominal: Soft. Bowel sounds are normal. There is no hepatosplenomegaly. There is no tenderness. There is no CVA tenderness.  Musculoskeletal: Normal range of motion.  Lymphadenopathy:    She has no cervical adenopathy.    She has no axillary adenopathy.  Neurological: She is alert and oriented to person, place, and time. She has normal reflexes. No cranial nerve deficit or sensory deficit.  Skin: Skin is warm, dry and intact. No rash noted.  Psychiatric: She has a normal mood and affect. Her speech is normal and behavior is normal. Thought content normal.  Nursing note and vitals reviewed.  Wt Readings from Last 3 Encounters:  07/18/18 138 lb (62.6 kg)  09/25/17 137 lb (62.1 kg)  09/08/17 138 lb (62.6 kg)    BP 100/66   Pulse 83   Ht 5\' 2"  (1.575 m)   Wt 138 lb (62.6 kg)   SpO2 98%   BMI 25.24 kg/m   Assessment and Plan: 1. Annual physical exam Continue healthy diet and exercise - Lipid panel - POCT urinalysis dipstick  2. Chronic bronchitis, simple ([place]) stable  3. Asthma in adult, mild intermittent, uncomplicated Doing  well on singulair - CBC with Differential/Platelet  4. Irritable bowel syndrome, unspecified type With constipation Samples Linzess 290 mg once a day - Comprehensive metabolic panel  5. Generalized anxiety disorder Doing well on lower dose bupropion - TSH   No orders of the defined types were placed in this encounter.   Partially dictated using Editor, commissioning. Any errors are unintentional.  Halina Maidens, MD [facility]  07/18/2018

## 2018-07-18 NOTE — Patient Instructions (Signed)
Linzess 290 mg once a day 30 minutes before breakfast.

## 2018-07-20 LAB — LIPID PANEL
CHOLESTEROL TOTAL: 197 mg/dL (ref 100–199)
Chol/HDL Ratio: 2.7 ratio (ref 0.0–4.4)
HDL: 74 mg/dL (ref >39)
LDL CALC: 111 mg/dL — AB (ref 0–99)
TRIGLYCERIDES: 60 mg/dL (ref 0–149)
VLDL CHOLESTEROL CAL: 12 mg/dL (ref 5–40)

## 2018-07-20 LAB — CBC WITH DIFFERENTIAL/PLATELET
BASOS ABS: 0 10*3/uL (ref 0.0–0.2)
Basos: 0 %
EOS (ABSOLUTE): 0 10*3/uL (ref 0.0–0.4)
Eos: 0 %
HEMOGLOBIN: 13.6 g/dL (ref 11.1–15.9)
Hematocrit: 40.9 % (ref 34.0–46.6)
IMMATURE GRANULOCYTES: 0 %
Immature Grans (Abs): 0 10*3/uL (ref 0.0–0.1)
Lymphocytes Absolute: 2.2 10*3/uL (ref 0.7–3.1)
Lymphs: 23 %
MCH: 32.1 pg (ref 26.6–33.0)
MCHC: 33.3 g/dL (ref 31.5–35.7)
MCV: 97 fL (ref 79–97)
MONOCYTES: 6 %
MONOS ABS: 0.6 10*3/uL (ref 0.1–0.9)
NEUTROS PCT: 71 %
Neutrophils Absolute: 6.8 10*3/uL (ref 1.4–7.0)
Platelets: 278 10*3/uL (ref 150–450)
RBC: 4.24 x10E6/uL (ref 3.77–5.28)
RDW: 13.5 % (ref 12.3–15.4)
WBC: 9.7 10*3/uL (ref 3.4–10.8)

## 2018-07-20 LAB — COMPREHENSIVE METABOLIC PANEL
ALK PHOS: 75 IU/L (ref 39–117)
ALT: 36 IU/L — ABNORMAL HIGH (ref 0–32)
AST: 42 IU/L — AB (ref 0–40)
Albumin/Globulin Ratio: 2.2 (ref 1.2–2.2)
Albumin: 4.6 g/dL (ref 3.5–5.5)
BUN/Creatinine Ratio: 19 (ref 9–23)
BUN: 15 mg/dL (ref 6–24)
Bilirubin Total: 0.2 mg/dL (ref 0.0–1.2)
CO2: 21 mmol/L (ref 20–29)
CREATININE: 0.8 mg/dL (ref 0.57–1.00)
Calcium: 9.6 mg/dL (ref 8.7–10.2)
Chloride: 104 mmol/L (ref 96–106)
GFR calc Af Amer: 106 mL/min/{1.73_m2} (ref >59)
GFR calc non Af Amer: 92 mL/min/{1.73_m2} (ref >59)
GLUCOSE: 89 mg/dL (ref 65–99)
Globulin, Total: 2.1 g/dL (ref 1.5–4.5)
Potassium: 4.8 mmol/L (ref 3.5–5.2)
Sodium: 141 mmol/L (ref 134–144)
Total Protein: 6.7 g/dL (ref 6.0–8.5)

## 2018-07-20 LAB — TSH: TSH: 2.05 u[IU]/mL (ref 0.450–4.500)

## 2018-08-07 ENCOUNTER — Encounter: Payer: Self-pay | Admitting: Internal Medicine

## 2018-08-07 ENCOUNTER — Other Ambulatory Visit
Admission: RE | Admit: 2018-08-07 | Discharge: 2018-08-07 | Disposition: A | Discharge status: Home / Self Care | Payer: BLUE CROSS/BLUE SHIELD | Source: Ambulatory Visit | Attending: Obstetrics & Gynecology | Admitting: Obstetrics & Gynecology

## 2018-08-07 DIAGNOSIS — N920 Excessive and frequent menstruation with regular cycle: Secondary | ICD-10-CM | POA: Diagnosis present

## 2018-08-07 LAB — APTT: aPTT: 32 seconds (ref 24–36)

## 2018-08-08 ENCOUNTER — Other Ambulatory Visit: Payer: Self-pay | Admitting: Internal Medicine

## 2018-08-08 MED ORDER — LINACLOTIDE 72 MCG PO CAPS: 72.0000 ug | ORAL_CAPSULE | Freq: Every day | ORAL | 0 refills | Status: DC

## 2018-08-08 NOTE — Telephone Encounter (Signed)
Patient wondering of a lower dose of medication. Please advise mychart msg.

## 2018-08-24 ENCOUNTER — Other Ambulatory Visit: Payer: Self-pay

## 2018-08-24 DIAGNOSIS — R7989 Other specified abnormal findings of blood chemistry: Secondary | ICD-10-CM

## 2018-08-24 DIAGNOSIS — R945 Abnormal results of liver function studies: Principal | ICD-10-CM

## 2018-08-25 LAB — COMPREHENSIVE METABOLIC PANEL
ALBUMIN: 4.5 g/dL (ref 3.5–5.5)
ALT: 27 IU/L (ref 0–32)
AST: 29 IU/L (ref 0–40)
Albumin/Globulin Ratio: 2 (ref 1.2–2.2)
Alkaline Phosphatase: 87 IU/L (ref 39–117)
BILIRUBIN TOTAL: 0.3 mg/dL (ref 0.0–1.2)
BUN/Creatinine Ratio: 22 (ref 9–23)
BUN: 20 mg/dL (ref 6–24)
CALCIUM: 9.4 mg/dL (ref 8.7–10.2)
CHLORIDE: 103 mmol/L (ref 96–106)
CO2: 23 mmol/L (ref 20–29)
CREATININE: 0.93 mg/dL (ref 0.57–1.00)
GFR calc non Af Amer: 77 mL/min/{1.73_m2} (ref >59)
GFR, EST AFRICAN AMERICAN: 88 mL/min/{1.73_m2} (ref >59)
GLUCOSE: 76 mg/dL (ref 65–99)
Globulin, Total: 2.3 g/dL (ref 1.5–4.5)
Potassium: 4.4 mmol/L (ref 3.5–5.2)
Sodium: 141 mmol/L (ref 134–144)
Total Protein: 6.8 g/dL (ref 6.0–8.5)

## 2018-08-28 ENCOUNTER — Other Ambulatory Visit: Payer: Self-pay | Admitting: Internal Medicine

## 2018-09-03 ENCOUNTER — Encounter: Payer: Self-pay | Admitting: Internal Medicine

## 2018-09-03 ENCOUNTER — Ambulatory Visit: Payer: No Typology Code available for payment source | Admitting: Internal Medicine

## 2018-09-03 VITALS — BP 112/68 | HR 114 | Temp 98.4°F | Ht 62.0 in | Wt 142.0 lb

## 2018-09-03 DIAGNOSIS — J0140 Acute pansinusitis, unspecified: Secondary | ICD-10-CM | POA: Diagnosis not present

## 2018-09-03 MED ORDER — ALBUTEROL SULFATE HFA 108 (90 BASE) MCG/ACT IN AERS: 2.0000 | INHALATION_SPRAY | Freq: Four times a day (QID) | RESPIRATORY_TRACT | 0 refills | Status: DC | PRN

## 2018-09-03 MED ORDER — LEVOFLOXACIN 500 MG PO TABS: 500.0000 mg | ORAL_TABLET | Freq: Every day | ORAL | 0 refills | Status: AC

## 2018-09-03 MED ORDER — BENZONATATE 100 MG PO CAPS: 100.0000 mg | ORAL_CAPSULE | Freq: Three times a day (TID) | ORAL | 0 refills | Status: AC

## 2018-09-03 MED ORDER — FLUTICASONE PROPIONATE 50 MCG/ACT NA SUSP: 2.0000 | Freq: Every day | NASAL | 6 refills | Status: DC

## 2018-09-03 NOTE — Progress Notes (Signed)
Date:  09/03/2018   Name:  Nancy Vega   DOB:  16-Jan-1976   MRN:  [id number]   Chief Complaint: Sinusitis (Started Thursday night- facial congestion, coughing. Wanted to make sure not bronchitis. ) Sinusitis  This is a new problem. The current episode started in the past 7 days. The problem has been gradually worsening since onset. Associated symptoms include congestion, coughing and sinus pressure. Pertinent negatives include no chills, headaches, shortness of breath or sore throat.    Review of Systems  Constitutional: Negative for chills, fatigue and fever.  HENT: Positive for congestion, postnasal drip and sinus pressure. Negative for sore throat and trouble swallowing.   Eyes: Negative for visual disturbance.  Respiratory: Positive for cough. Negative for chest tightness, shortness of breath and wheezing.   Cardiovascular: Negative for chest pain and palpitations.  Neurological: Negative for dizziness and headaches.    Patient Active Problem List   Diagnosis Date Noted  . Chronic bronchitis, simple ([place]) 07/18/2018  . Dense breast tissue 03/21/2016  . Anxiety disorder 07/13/2015  . Asthma in adult, mild intermittent, uncomplicated 40/81/4481  . Essential tremor 07/13/2015  . Irritable bowel syndrome with constipation 07/13/2015    Allergies  Allergen Reactions  . Doxycycline Nausea And Vomiting  . Amoxicillin Hives  . Cefdinir Other (See Comments)  . Iodine Solution [Povidone Iodine]     Allergy to cleaning Iodine solution   . Nsaids Other (See Comments)    Tremor intensity  . Tamiflu [Oseltamivir] Hives  . Zithromax [Azithromycin] Hives and Other (See Comments)    Past Surgical History:  Procedure Laterality Date  . APPENDECTOMY  06/2015  . BREAST EXCISIONAL BIOPSY Right 2016   neg  . BREAST EXCISIONAL BIOPSY Left 04/2016   fibroadenoma/ us/bx - clip-   . BREAST LUMPECTOMY Right 2012   neg  . KNEE SURGERY Right 2011  . TONSILLECTOMY Bilateral      Social History   Tobacco Use  . Smoking status: Never Smoker  . Smokeless tobacco: Never Used  Substance Use Topics  . Alcohol use: Yes    Alcohol/week: 2.0 standard drinks    Types: 2 Standard drinks or equivalent per week    Comment: occassion  . Drug use: No     Medication list has been reviewed and updated.  Current Meds  Medication Sig  . ALPRAZolam (XANAX) 0.5 MG tablet Take 1 tablet by mouth 2 (two) times daily as needed.  Marland Kitchen buPROPion (WELLBUTRIN XL) 300 MG 24 hr tablet Take 1 tablet (300 mg total) by mouth daily.  Marland Kitchen levonorgestrel (MIRENA) 20 MCG/24HR IUD 1 each by Intrauterine route once.  . linaclotide (LINZESS) 72 MCG capsule Take 1 capsule (72 mcg total) by mouth daily before breakfast. (Patient taking differently: Take 72 mcg by mouth every 3 (three) days. )  . montelukast (SINGULAIR) 10 MG tablet TAKE ONE (1) TABLET BY MOUTH ONCE DAILY  . primidone (MYSOLINE) 50 MG tablet Take 75 mg by mouth 2 (two) times daily.    PHQ 2/9 Scores 09/08/2017  PHQ - 2 Score 0    Physical Exam  Constitutional: She is oriented to person, place, and time. She appears well-developed and well-nourished.  HENT:  Right Ear: External ear and ear canal normal. Tympanic membrane is not erythematous and not retracted.  Left Ear: External ear and ear canal normal. Tympanic membrane is not erythematous and not retracted.  Nose: Right sinus exhibits maxillary sinus tenderness. Right sinus exhibits no frontal sinus tenderness. Left  sinus exhibits maxillary sinus tenderness. Left sinus exhibits no frontal sinus tenderness.  Mouth/Throat: Uvula is midline and mucous membranes are normal. No oral lesions. Posterior oropharyngeal erythema present. No oropharyngeal exudate.  Neck: Normal range of motion. Neck supple.  Cardiovascular: Normal rate, regular rhythm and normal heart sounds.  Pulmonary/Chest: Effort normal and breath sounds normal. She has no wheezes. She has no rales.   Lymphadenopathy:    She has no cervical adenopathy.  Neurological: She is alert and oriented to person, place, and time.    BP 112/68 (BP Location: Right Arm, Patient Position: Sitting, Cuff Size: Normal)   Pulse (!) 114   Temp 98.4 F (36.9 C) (Oral)   Ht 5\' 2"  (1.575 m)   Wt 142 lb (64.4 kg)   SpO2 99%   BMI 25.97 kg/m   Assessment and Plan 1. Acute non-recurrent pansinusitis - levofloxacin (LEVAQUIN) 500 MG tablet; Take 1 tablet (500 mg total) by mouth daily for 7 days.  Dispense: 7 tablet; Refill: 0 - fluticasone (FLONASE) 50 MCG/ACT nasal spray; Place 2 sprays into both nostrils daily.  Dispense: 16 g; Refill: 6 - benzonatate (TESSALON) 100 MG capsule; Take 1 capsule (100 mg total) by mouth 3 (three) times daily for 10 days.  Dispense: 30 capsule; Refill: 0   Meds ordered this encounter  Medications  . levofloxacin (LEVAQUIN) 500 MG tablet    Sig: Take 1 tablet (500 mg total) by mouth daily for 7 days.    Dispense:  7 tablet    Refill:  0  . albuterol (PROVENTIL HFA;VENTOLIN HFA) 108 (90 Base) MCG/ACT inhaler    Sig: Inhale 2 puffs into the lungs every 6 (six) hours as needed for wheezing or shortness of breath.    Dispense:  1 Inhaler    Refill:  0  . fluticasone (FLONASE) 50 MCG/ACT nasal spray    Sig: Place 2 sprays into both nostrils daily.    Dispense:  16 g    Refill:  6  . benzonatate (TESSALON) 100 MG capsule    Sig: Take 1 capsule (100 mg total) by mouth 3 (three) times daily for 10 days.    Dispense:  30 capsule    Refill:  0    Partially dictated using Editor, commissioning. Any errors are unintentional.  Halina Maidens, MD [facility]  09/03/2018

## 2018-10-22 ENCOUNTER — Ambulatory Visit: Payer: No Typology Code available for payment source | Admitting: Internal Medicine

## 2018-10-22 ENCOUNTER — Encounter: Payer: Self-pay | Admitting: Internal Medicine

## 2018-10-22 VITALS — BP 112/60 | HR 100 | Temp 98.0°F | Ht 62.0 in | Wt 139.0 lb

## 2018-10-22 DIAGNOSIS — J410 Simple chronic bronchitis: Secondary | ICD-10-CM | POA: Diagnosis not present

## 2018-10-22 DIAGNOSIS — J209 Acute bronchitis, unspecified: Secondary | ICD-10-CM

## 2018-10-22 MED ORDER — ALBUTEROL SULFATE HFA 108 (90 BASE) MCG/ACT IN AERS
2.0000 | INHALATION_SPRAY | Freq: Four times a day (QID) | RESPIRATORY_TRACT | 5 refills | Status: DC | PRN
Start: 2018-10-22 — End: 2019-03-18

## 2018-10-22 MED ORDER — PREDNISONE 10 MG PO TABS: 10.0000 mg | ORAL_TABLET | ORAL | 0 refills | Status: AC

## 2018-10-22 MED ORDER — BENZONATATE 100 MG PO CAPS: 100.0000 mg | ORAL_CAPSULE | Freq: Three times a day (TID) | ORAL | 0 refills | Status: DC

## 2018-10-22 NOTE — Progress Notes (Signed)
Date:  10/22/2018   Name:  Nancy Vega   DOB:  1976-02-05   MRN:  [id number]   Chief Complaint: Cough (Coughing- still not gone. On halloween night- starting coughing. Almost passed out because coughing so hard could not catch breathe. Tried to get inhaler and did not help until 15 mins later. Dry cough- no production. Would like chest Xray to confirm no pnuemonia. Needs TDAP shot at later visit for work. Told her to make nurse visit when feeling better. )  Cough  This is a chronic problem. The current episode started more than 1 month ago. The problem has been unchanged. The problem occurs every few minutes. The cough is non-productive. Associated symptoms include shortness of breath and wheezing. Pertinent negatives include no chest pain, chills or headaches. The symptoms are aggravated by exercise, stress and cold air. She has tried leukotriene antagonists and a beta-agonist inhaler for the symptoms. The treatment provided mild relief. Her past medical history is significant for bronchitis.    Review of Systems  Constitutional: Negative for chills and fatigue.  Respiratory: Positive for cough, shortness of breath and wheezing. Negative for chest tightness.   Cardiovascular: Negative for chest pain, palpitations and leg swelling.  Neurological: Negative for dizziness and headaches.    Patient Active Problem List   Diagnosis Date Noted  . Chronic bronchitis, simple ([facility]) 07/18/2018  . Dense breast tissue 03/21/2016  . Anxiety disorder 07/13/2015  . Asthma in adult, mild intermittent, uncomplicated 96/75/9163  . Essential tremor 07/13/2015  . Irritable bowel syndrome with constipation 07/13/2015    Allergies  Allergen Reactions  . Doxycycline Nausea And Vomiting  . Amoxicillin Hives  . Cefdinir Other (See Comments)  . Iodine Solution [Povidone Iodine]     Allergy to cleaning Iodine solution   . Nsaids Other (See Comments)    Tremor intensity  . Tamiflu [Oseltamivir]  Hives  . Zithromax [Azithromycin] Hives and Other (See Comments)    Past Surgical History:  Procedure Laterality Date  . APPENDECTOMY  06/2015  . BREAST EXCISIONAL BIOPSY Right 2016   neg  . BREAST EXCISIONAL BIOPSY Left 04/2016   fibroadenoma/ us/bx - clip-   . BREAST LUMPECTOMY Right 2012   neg  . KNEE SURGERY Right 2011  . TONSILLECTOMY Bilateral     Social History   Tobacco Use  . Smoking status: Never Smoker  . Smokeless tobacco: Never Used  Substance Use Topics  . Alcohol use: Yes    Alcohol/week: 2.0 standard drinks    Types: 2 Standard drinks or equivalent per week    Comment: occassion  . Drug use: No     Medication list has been reviewed and updated.  Current Meds  Medication Sig  . albuterol (PROVENTIL HFA;VENTOLIN HFA) 108 (90 Base) MCG/ACT inhaler Inhale 2 puffs into the lungs every 6 (six) hours as needed for wheezing or shortness of breath.  . ALPRAZolam (XANAX) 0.5 MG tablet Take 1 tablet by mouth 2 (two) times daily as needed.  Marland Kitchen buPROPion (WELLBUTRIN XL) 300 MG 24 hr tablet Take 1 tablet (300 mg total) by mouth daily.  . fluticasone (FLONASE) 50 MCG/ACT nasal spray Place 2 sprays into both nostrils daily.  Marland Kitchen levonorgestrel (MIRENA) 20 MCG/24HR IUD 1 each by Intrauterine route once.  . linaclotide (LINZESS) 72 MCG capsule Take 1 capsule (72 mcg total) by mouth daily before breakfast. (Patient taking differently: Take 72 mcg by mouth every 3 (three) days. )  . montelukast (SINGULAIR) 10 MG  tablet TAKE ONE (1) TABLET BY MOUTH ONCE DAILY  . primidone (MYSOLINE) 50 MG tablet Take 75 mg by mouth 2 (two) times daily.    PHQ 2/9 Scores 10/22/2018 09/08/2017  PHQ - 2 Score 0 0    Physical Exam  Constitutional: She is oriented to person, place, and time. She appears well-developed. No distress.  HENT:  Head: Normocephalic and atraumatic.  Eyes: Pupils are equal, round, and reactive to light.  Neck: Normal range of motion. Neck supple.  Cardiovascular: Normal  rate, regular rhythm and normal heart sounds.  Pulmonary/Chest: Effort normal and breath sounds normal. No respiratory distress.  Frequent dry cough during exam  Musculoskeletal: Normal range of motion.  Lymphadenopathy:    She has no cervical adenopathy.  Neurological: She is alert and oriented to person, place, and time.  Skin: Skin is warm and dry. No rash noted.  Psychiatric: She has a normal mood and affect. Her behavior is normal. Thought content normal.  Nursing note and vitals reviewed.   BP 112/60 (BP Location: Right Arm, Patient Position: Sitting, Cuff Size: Normal)   Pulse 100   Temp 98 F (36.7 C) (Oral)   Ht 5\' 2"  (1.575 m)   Wt 139 lb (63 kg)   SpO2 99%   BMI 25.42 kg/m   Assessment and Plan: 1. Chronic bronchitis, simple ([place]) Will try steroid taper May need Pulmonary consult if not improvement - predniSONE (DELTASONE) 10 MG tablet; Take 1 tablet (10 mg total) by mouth as directed for 6 days. Take 6,5,4,3,2,1 then stop  Dispense: 21 tablet; Refill: 0 - benzonatate (TESSALON) 100 MG capsule; Take 1 capsule (100 mg total) by mouth 3 (three) times daily.  Dispense: 20 capsule; Refill: 0  2. Bronchospasm with bronchitis, acute Continue proair q6hr prn - albuterol (PROAIR HFA) 108 (90 Base) MCG/ACT inhaler; Inhale 2 puffs into the lungs every 6 (six) hours as needed for wheezing or shortness of breath.  Dispense: 1 each; Refill: 5   Partially dictated using Editor, commissioning. Any errors are unintentional.  Halina Maidens, MD [facility]  10/22/2018

## 2018-10-29 ENCOUNTER — Other Ambulatory Visit: Payer: Self-pay

## 2018-10-29 ENCOUNTER — Telehealth: Payer: Self-pay

## 2018-10-29 ENCOUNTER — Other Ambulatory Visit: Payer: Self-pay | Admitting: Internal Medicine

## 2018-10-29 DIAGNOSIS — J410 Simple chronic bronchitis: Secondary | ICD-10-CM

## 2018-10-29 MED ORDER — LEVOFLOXACIN 500 MG PO TABS
500.0000 mg | ORAL_TABLET | Freq: Every day | ORAL | 0 refills | Status: DC
Start: 2018-10-29 — End: 2018-10-29

## 2018-10-29 MED ORDER — LEVOFLOXACIN 500 MG PO TABS: 500.0000 mg | ORAL_TABLET | Freq: Every day | ORAL | 0 refills | Status: DC

## 2018-10-29 NOTE — Telephone Encounter (Signed)
Was told if still coughing Monday to call back. She states it takes 3 rounds of Abx usually to get her cough to go away. I advised may need another OV but she said she was told by PCP to call in.

## 2018-10-29 NOTE — Telephone Encounter (Signed)
Sent Levaquin to [facility]

## 2018-11-06 ENCOUNTER — Encounter: Payer: Self-pay | Admitting: Internal Medicine

## 2018-11-06 ENCOUNTER — Ambulatory Visit: Payer: No Typology Code available for payment source | Admitting: Internal Medicine

## 2018-11-06 VITALS — BP 112/60 | HR 66 | Ht 62.0 in | Wt 139.0 lb

## 2018-11-06 DIAGNOSIS — Z4802 Encounter for removal of sutures: Secondary | ICD-10-CM

## 2018-11-06 DIAGNOSIS — S0083XA Contusion of other part of head, initial encounter: Secondary | ICD-10-CM

## 2018-11-06 NOTE — Progress Notes (Signed)
Date:  11/06/2018   Name:  Nancy Vega   DOB:  07-30-1976   MRN:  [id number]   Chief Complaint: Suture / Levert Feinstein Removal  [place] on 10/31/18 and hit forehead on bathtub.  She got sutures at the ED.  Taking advil.  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 Tdap is up to date.  Review of Systems  Constitutional: Negative for chills and fever.  Musculoskeletal:       Jaw pain   Skin: Positive for wound.    Patient Active Problem List   Diagnosis Date Noted  . Bronchospasm with bronchitis, acute 10/22/2018  . Chronic bronchitis, simple ([place]) 07/18/2018  . Dense breast tissue 03/21/2016  . Anxiety disorder 07/13/2015  . Asthma in adult, mild intermittent, uncomplicated 56/31/4970  . Essential tremor 07/13/2015  . Irritable bowel syndrome with constipation 07/13/2015    Allergies  Allergen Reactions  . Doxycycline Nausea And Vomiting  . Amoxicillin Hives  . Cefdinir Other (See Comments)  . Iodine Solution [Povidone Iodine]     Allergy to cleaning Iodine solution   . Nsaids Other (See Comments)    Tremor intensity  . Tamiflu [Oseltamivir] Hives  . Zithromax [Azithromycin] Hives and Other (See Comments)    Past Surgical History:  Procedure Laterality Date  . APPENDECTOMY  06/2015  . BREAST EXCISIONAL BIOPSY Right 2016   neg  . BREAST EXCISIONAL BIOPSY Left 04/2016   fibroadenoma/ us/bx - clip-   . BREAST LUMPECTOMY Right 2012   neg  . KNEE SURGERY Right 2011  . TONSILLECTOMY Bilateral     Social History   Tobacco Use  . Smoking status: Never Smoker  . Smokeless tobacco: Never Used  Substance Use Topics  . Alcohol use: Yes    Alcohol/week: 2.0 standard drinks    Types: 2 Standard drinks or equivalent per week    Comment: occassion  . Drug use: No     Medication list has been reviewed and updated.  Current Meds  Medication Sig  . albuterol (PROAIR HFA) 108 (90 Base) MCG/ACT inhaler Inhale 2  puffs into the lungs every 6 (six) hours as needed for wheezing or shortness of breath.  . ALPRAZolam (XANAX) 0.5 MG tablet Take 1 tablet by mouth 2 (two) times daily as needed.  Marland Kitchen buPROPion (WELLBUTRIN XL) 300 MG 24 hr tablet Take 1 tablet (300 mg total) by mouth daily.  . fluticasone (FLONASE) 50 MCG/ACT nasal spray Place 2 sprays into both nostrils daily.  Marland Kitchen levonorgestrel (MIRENA) 20 MCG/24HR IUD 1 each by Intrauterine route once.  . linaclotide (LINZESS) 72 MCG capsule Take 1 capsule (72 mcg total) by mouth daily before breakfast. (Patient taking differently: Take 72 mcg by mouth every 3 (three) days. )  . montelukast (SINGULAIR) 10 MG tablet TAKE ONE (1) TABLET BY MOUTH ONCE DAILY  . primidone (MYSOLINE) 50 MG tablet Take 75 mg by mouth 2 (two) times daily.    PHQ 2/9 Scores 10/22/2018 09/08/2017  PHQ - 2 Score 0 0    Physical Exam  Eyes: Pupils are equal, round, and reactive to light. EOM are normal.  Resolving bruise around right eye   Cardiovascular: Normal rate, regular rhythm and normal heart sounds.  Pulmonary/Chest: Effort normal and breath sounds normal.  Musculoskeletal:  TMJ click bilaterally Tender under right orbit - no deformity noted  Skin:       BP 112/60 (BP Location: Right Arm, Patient  Position: Sitting, Cuff Size: Normal)   Pulse 66   Ht 5\' 2"  (1.575 m)   Wt 139 lb (63 kg)   SpO2 98%   BMI 25.42 kg/m   Assessment and Plan: 1. Facial contusion, initial encounter Continue Advil as needed  2. Encounter for removal of sutures Removed with excellent approximation and healing   Partially dictated using Editor, commissioning. Any errors are unintentional.  Halina Maidens, MD [facility]  11/06/2018

## 2018-11-29 ENCOUNTER — Encounter: Payer: Self-pay | Admitting: Internal Medicine

## 2018-11-29 ENCOUNTER — Ambulatory Visit: Payer: No Typology Code available for payment source | Admitting: Internal Medicine

## 2018-11-29 VITALS — BP 112/70 | HR 71 | Temp 99.4°F | Ht 62.0 in | Wt 143.0 lb

## 2018-11-29 DIAGNOSIS — J0100 Acute maxillary sinusitis, unspecified: Secondary | ICD-10-CM

## 2018-11-29 MED ORDER — LEVOFLOXACIN 500 MG PO TABS: 500.0000 mg | ORAL_TABLET | Freq: Every day | ORAL | 0 refills | Status: AC

## 2018-11-29 NOTE — Progress Notes (Signed)
Date:  11/29/2018   Name:  Nancy Vega   DOB:  [birth year]   MRN:  [id number]   Chief Complaint: Sinusitis (Started yesterday. Sinus pressure and pain. No fever or chills. No body aches. )  Sinusitis  This is a new problem. The current episode started yesterday. There has been no fever. The pain is mild. Associated symptoms include headaches and sinus pressure. Pertinent negatives include no chills, congestion, coughing, ear pain or sore throat. Past treatments include spray decongestants. The treatment provided mild relief.    Review of Systems  Constitutional: Negative for chills, fatigue and fever.  HENT: Positive for sinus pressure. Negative for congestion, ear pain, sore throat and trouble swallowing.   Respiratory: Negative for cough, chest tightness and wheezing.   Neurological: Positive for headaches. Negative for dizziness.    Patient Active Problem List   Diagnosis Date Noted  . Bronchospasm with bronchitis, acute 10/22/2018  . Chronic bronchitis, simple ([place]) 07/18/2018  . Dense breast tissue 03/21/2016  . Anxiety disorder 07/13/2015  . Asthma in adult, mild intermittent, uncomplicated 87/68/1157  . Essential tremor 07/13/2015  . Irritable bowel syndrome with constipation 07/13/2015    Allergies  Allergen Reactions  . Doxycycline Nausea And Vomiting  . Amoxicillin Hives  . Cefdinir Other (See Comments)  . Iodine Solution [Povidone Iodine]     Allergy to cleaning Iodine solution   . Nsaids Other (See Comments)    Tremor intensity  . Tamiflu [Oseltamivir] Hives  . Zithromax [Azithromycin] Hives and Other (See Comments)    Past Surgical History:  Procedure Laterality Date  . APPENDECTOMY  06/2015  . BREAST EXCISIONAL BIOPSY Right 2016   neg  . BREAST EXCISIONAL BIOPSY Left 04/2016   fibroadenoma/ us/bx - clip-   . BREAST LUMPECTOMY Right 2012   neg  . KNEE SURGERY Right 2011  . TONSILLECTOMY Bilateral     Social History   Tobacco Use  .  Smoking status: Never Smoker  . Smokeless tobacco: Never Used  Substance Use Topics  . Alcohol use: Yes    Alcohol/week: 2.0 standard drinks    Types: 2 Standard drinks or equivalent per week    Comment: occassion  . Drug use: No     Medication list has been reviewed and updated.  Current Meds  Medication Sig  . albuterol (PROAIR HFA) 108 (90 Base) MCG/ACT inhaler Inhale 2 puffs into the lungs every 6 (six) hours as needed for wheezing or shortness of breath.  . ALPRAZolam (XANAX) 0.5 MG tablet Take 1 tablet by mouth 2 (two) times daily as needed.  Marland Kitchen buPROPion (WELLBUTRIN XL) 300 MG 24 hr tablet Take 1 tablet (300 mg total) by mouth daily.  . fluticasone (FLONASE) 50 MCG/ACT nasal spray Place 2 sprays into both nostrils daily.  Marland Kitchen levonorgestrel (MIRENA) 20 MCG/24HR IUD 1 each by Intrauterine route once.  . linaclotide (LINZESS) 72 MCG capsule Take 1 capsule (72 mcg total) by mouth daily before breakfast. (Patient taking differently: Take 72 mcg by mouth every 3 (three) days. )  . montelukast (SINGULAIR) 10 MG tablet TAKE ONE (1) TABLET BY MOUTH ONCE DAILY  . primidone (MYSOLINE) 50 MG tablet Take 75 mg by mouth 2 (two) times daily.    PHQ 2/9 Scores 10/22/2018 09/08/2017  PHQ - 2 Score 0 0    Physical Exam Constitutional:      Appearance: She is well-developed.  HENT:     Right Ear: Ear canal and external ear normal. Tympanic membrane  is not erythematous or retracted.     Left Ear: Ear canal and external ear normal. Tympanic membrane is not erythematous or retracted.     Nose:     Right Sinus: Maxillary sinus tenderness and frontal sinus tenderness present.     Left Sinus: Maxillary sinus tenderness and frontal sinus tenderness present.     Mouth/Throat:     Mouth: No oral lesions.     Pharynx: Uvula midline. Posterior oropharyngeal erythema present. No oropharyngeal exudate.  Cardiovascular:     Rate and Rhythm: Normal rate and regular rhythm.     Heart sounds: Normal heart  sounds.  Pulmonary:     Breath sounds: Normal breath sounds. No wheezing or rales.  Lymphadenopathy:     Cervical: No cervical adenopathy.  Neurological:     Mental Status: She is alert and oriented to person, place, and time.     BP 112/70 (BP Location: Right Arm, Patient Position: Sitting, Cuff Size: Normal)   Pulse 71   Temp 99.4 F (37.4 C) (Oral)   Ht 5\' 2"  (1.575 m)   Wt 143 lb (64.9 kg)   SpO2 99%   BMI 26.16 kg/m   Assessment and Plan: 1. Acute non-recurrent maxillary sinusitis Continue singulair, flonase, inhaler and add saline nasal spray - levofloxacin (LEVAQUIN) 500 MG tablet; Take 1 tablet (500 mg total) by mouth daily for 10 days.  Dispense: 10 tablet; Refill: 0   Partially dictated using Editor, commissioning. Any errors are unintentional.  Halina Maidens, MD [facility]  11/29/2018

## 2019-01-07 ENCOUNTER — Other Ambulatory Visit: Payer: Self-pay | Admitting: Internal Medicine

## 2019-01-10 ENCOUNTER — Encounter: Payer: Self-pay | Admitting: Internal Medicine

## 2019-01-10 NOTE — Telephone Encounter (Signed)
Please advise message and picture.  Thank you.

## 2019-02-07 ENCOUNTER — Encounter: Payer: Self-pay | Admitting: Internal Medicine

## 2019-02-08 NOTE — Telephone Encounter (Signed)
Patient response

## 2019-02-08 NOTE — Telephone Encounter (Signed)
Please advise patient message.

## 2019-02-11 ENCOUNTER — Other Ambulatory Visit: Payer: Self-pay | Admitting: Internal Medicine

## 2019-02-11 ENCOUNTER — Encounter: Payer: Self-pay | Admitting: Internal Medicine

## 2019-02-11 DIAGNOSIS — F411 Generalized anxiety disorder: Secondary | ICD-10-CM

## 2019-02-11 MED ORDER — BUPROPION HCL ER (XL) 150 MG PO TB24: 150.0000 mg | ORAL_TABLET | Freq: Every day | ORAL | 0 refills | Status: DC

## 2019-02-11 NOTE — Telephone Encounter (Signed)
Please advise pt message?

## 2019-02-26 ENCOUNTER — Other Ambulatory Visit: Payer: Self-pay

## 2019-02-26 ENCOUNTER — Encounter: Payer: Self-pay | Admitting: Internal Medicine

## 2019-02-26 DIAGNOSIS — F411 Generalized anxiety disorder: Secondary | ICD-10-CM

## 2019-02-26 MED ORDER — BUPROPION HCL ER (XL) 150 MG PO TB24: 150.0000 mg | ORAL_TABLET | Freq: Every day | ORAL | 1 refills | Status: DC

## 2019-02-28 ENCOUNTER — Telehealth: Payer: Self-pay

## 2019-02-28 MED ORDER — BUSPIRONE HCL 15 MG PO TABS: 15.0000 mg | ORAL_TABLET | Freq: Two times a day (BID) | ORAL | 0 refills | Status: DC

## 2019-02-28 NOTE — Telephone Encounter (Signed)
Patient called needing change in medicine as her and Dr Army Melia talked about in her messages on my chart. Said she needed Buspar in place of Wellbutrin. Sent in Medication and told pt she will need to follow up in 6 weeks. She says she is going to [place] so she will call when she gets back since our message on the phone system says we cannot be seen if we have been to [place].  Told her just call me in a month and I will speak with office manager and Dr Army Melia.

## 2019-03-14 ENCOUNTER — Encounter: Payer: Self-pay | Admitting: Internal Medicine

## 2019-03-14 ENCOUNTER — Other Ambulatory Visit: Payer: Self-pay | Admitting: Internal Medicine

## 2019-03-14 DIAGNOSIS — F411 Generalized anxiety disorder: Secondary | ICD-10-CM

## 2019-03-14 MED ORDER — BUPROPION HCL ER (XL) 150 MG PO TB24: 150.0000 mg | ORAL_TABLET | Freq: Every day | ORAL | 1 refills | Status: DC

## 2019-03-15 ENCOUNTER — Other Ambulatory Visit: Payer: Self-pay | Admitting: Internal Medicine

## 2019-03-18 ENCOUNTER — Encounter: Payer: Self-pay | Admitting: Internal Medicine

## 2019-03-18 ENCOUNTER — Ambulatory Visit (INDEPENDENT_AMBULATORY_CARE_PROVIDER_SITE_OTHER): Payer: No Typology Code available for payment source | Admitting: Internal Medicine

## 2019-03-18 ENCOUNTER — Other Ambulatory Visit: Payer: Self-pay

## 2019-03-18 VITALS — Temp 97.3°F | Ht 62.0 in | Wt 145.0 lb

## 2019-03-18 DIAGNOSIS — J4520 Mild intermittent asthma, uncomplicated: Secondary | ICD-10-CM | POA: Diagnosis not present

## 2019-03-18 DIAGNOSIS — F411 Generalized anxiety disorder: Secondary | ICD-10-CM

## 2019-03-18 MED ORDER — PREDNISONE 10 MG PO TABS: 10.0000 mg | ORAL_TABLET | ORAL | 0 refills | Status: AC

## 2019-03-18 MED ORDER — FLUTICASONE PROPIONATE 50 MCG/ACT NA SUSP: 2.0000 | Freq: Every day | NASAL | 6 refills | Status: DC

## 2019-03-18 MED ORDER — ALBUTEROL SULFATE HFA 108 (90 BASE) MCG/ACT IN AERS: 2.0000 | INHALATION_SPRAY | Freq: Four times a day (QID) | RESPIRATORY_TRACT | 5 refills | Status: DC | PRN

## 2019-03-18 NOTE — Progress Notes (Signed)
Date:  03/18/2019   Name:  Nancy Vega   DOB:  08-26-1976   MRN:  [id number]  I connected with this patient, Nancy Vega, by telephone at the patient's home . I verified that I am speaking with the correct person using two identifiers. This visit was conducted via telephone due to the Covid-19 outbreak from my office at [facility] in [place], [place]. I discussed the limitations, risks, security and privacy concerns of performing an evaluation and management service by telephone. I also discussed with the patient that there may be a patient responsible charge related to this service. The patient expressed understanding and agreed to proceed.  Chief Complaint: Asthma (Flonase refill and Inhaler refill needed... cough x 1 week no travel so102me wheezing when lying down. )  Asthma  She complains of chest tightness, cough, shortness of breath and wheezing. There is no sputum production. This is a new problem. The current episode started in the past 7 days. The problem occurs constantly. The problem has been gradually worsening. The cough is non-productive, dry and hacking. Pertinent negatives include no chest pain, ear pain or fever. Her symptoms are not alleviated by beta-agonist. Her past medical history is significant for asthma.  Anxiety  Presents for follow-up visit. Symptoms include excessive worry, nervous/anxious behavior and shortness of breath. Patient reports no chest pain or irritability. Symptoms occur most days.   Her past medical history is significant for asthma. Compliance with medications: got very tired and dizzy on Buspar.  Went back to Bupropion for days ago and is doing much better.    Review of Systems  Constitutional: Negative for fever and irritability.  HENT: Negative for ear pain.   Respiratory: Positive for cough, shortness of breath and wheezing. Negative for sputum production.   Cardiovascular: Negative for chest pain.  Psychiatric/Behavioral: The  patient is nervous/anxious.     Patient Active Problem List   Diagnosis Date Noted  . Bronchospasm with bronchitis, acute 10/22/2018  . Chronic bronchitis, simple ([place]) 07/18/2018  . Dense breast tissue 03/21/2016  . Anxiety disorder 07/13/2015  . Asthma in adult, mild intermittent, uncomplicated 90/30/0923  . Essential tremor 07/13/2015  . Irritable bowel syndrome with constipation 07/13/2015    Allergies  Allergen Reactions  . Doxycycline Nausea And Vomiting  . Amoxicillin Hives  . Cefdinir Other (See Comments)  . Iodine Solution [Povidone Iodine]     Allergy to cleaning Iodine solution   . Nsaids Other (See Comments)    Tremor intensity  . Tamiflu [Oseltamivir] Hives  . Zithromax [Azithromycin] Hives and Other (See Comments)  . Buspirone Other (See Comments)    Dizziness and fatigue    Past Surgical History:  Procedure Laterality Date  . APPENDECTOMY  06/2015  . BREAST EXCISIONAL BIOPSY Right 2016   neg  . BREAST EXCISIONAL BIOPSY Left 04/2016   fibroadenoma/ us/bx - clip-   . BREAST LUMPECTOMY Right 2012   neg  . KNEE SURGERY Right 2011  . TONSILLECTOMY Bilateral     Social History   Tobacco Use  . Smoking status: Never Smoker  . Smokeless tobacco: Never Used  Substance Use Topics  . Alcohol use: Yes    Alcohol/week: 2.0 standard drinks    Types: 2 Standard drinks or equivalent per week    Comment: occassion  . Drug use: No     Medication list has been reviewed and updated.  Current Meds  Medication Sig  . albuterol (PROAIR HFA) 108 (90 Base) MCG/ACT  inhaler Inhale 2 puffs into the lungs every 6 (six) hours as needed for wheezing or shortness of breath.  . ALPRAZolam (XANAX) 0.5 MG tablet Take 1 tablet by mouth 2 (two) times daily as needed.  Marland Kitchen buPROPion (WELLBUTRIN XL) 150 MG 24 hr tablet Take 1 tablet (150 mg total) by mouth daily.  . fluticasone (FLONASE) 50 MCG/ACT nasal spray Place 2 sprays into both nostrils daily.  Marland Kitchen levonorgestrel (MIRENA) 20  MCG/24HR IUD 1 each by Intrauterine route once.  . montelukast (SINGULAIR) 10 MG tablet TAKE ONE (1) TABLET BY MOUTH ONCE DAILY  . primidone (MYSOLINE) 50 MG tablet Take 100 mg by mouth 2 (two) times daily.   . [DISCONTINUED] albuterol (PROAIR HFA) 108 (90 Base) MCG/ACT inhaler Inhale 2 puffs into the lungs every 6 (six) hours as needed for wheezing or shortness of breath.  . [DISCONTINUED] busPIRone (BUSPAR) 15 MG tablet Take 1 tablet (15 mg total) by mouth 2 (two) times daily.  . [DISCONTINUED] fluticasone (FLONASE) 50 MCG/ACT nasal spray Place 2 sprays into both nostrils daily.    PHQ 2/9 Scores 03/18/2019 10/22/2018 09/08/2017  PHQ - 2 Score 1 0 0  PHQ- 9 Score 4 - -   GAD 7 : Generalized Anxiety Score 03/18/2019  Nervous, Anxious, on Edge 1  Control/stop worrying 3  Worry too much - different things 1  Trouble relaxing 0  Restless 0  Easily annoyed or irritable 0  Afraid - awful might happen 0  Total GAD 7 Score 5      BP Readings from Last 3 Encounters:  11/29/18 112/70  11/06/18 112/60  10/22/18 112/60    Physical Exam  Wt Readings from Last 3 Encounters:  03/18/19 145 lb (65.8 kg)  11/29/18 143 lb (64.9 kg)  11/06/18 139 lb (63 kg)    Temp (!) 97.3 F (36.3 C)   Ht 5\' 2"  (1.575 m)   Wt 145 lb (65.8 kg)   BMI 26.52 kg/m   Assessment and Plan: 1. Asthma in adult, mild intermittent, uncomplicated Resume flonase, steroid taper for mild flare Continue albuterol MDI - fluticasone (FLONASE) 50 MCG/ACT nasal spray; Place 2 sprays into both nostrils daily.  Dispense: 16 g; Refill: 6 - albuterol (PROAIR HFA) 108 (90 Base) MCG/ACT inhaler; Inhale 2 puffs into the lungs every 6 (six) hours as needed for wheezing or shortness of breath.  Dispense: 1 each; Refill: 5 - predniSONE (DELTASONE) 10 MG tablet; Take 1 tablet (10 mg total) by mouth as directed for 6 days. Take 6,5,4,3,2,1 then stop  Dispense: 21 tablet; Refill: 0  2. Generalized anxiety disorder Doing better now,  back on Wellbutrin  I spent 8 minutes on this encounter.  Partially dictated using Editor, commissioning. Any errors are unintentional.  Halina Maidens, MD [facility]  03/18/2019

## 2019-03-26 ENCOUNTER — Other Ambulatory Visit: Payer: Self-pay

## 2019-03-26 ENCOUNTER — Encounter: Payer: Self-pay | Admitting: Internal Medicine

## 2019-03-26 ENCOUNTER — Ambulatory Visit (INDEPENDENT_AMBULATORY_CARE_PROVIDER_SITE_OTHER): Payer: No Typology Code available for payment source | Admitting: Internal Medicine

## 2019-03-26 VITALS — BP 130/80 | HR 112 | Temp 98.5°F | Ht 62.0 in | Wt 145.0 lb

## 2019-03-26 DIAGNOSIS — J410 Simple chronic bronchitis: Secondary | ICD-10-CM | POA: Diagnosis not present

## 2019-03-26 DIAGNOSIS — R42 Dizziness and giddiness: Secondary | ICD-10-CM

## 2019-03-26 DIAGNOSIS — R05 Cough: Secondary | ICD-10-CM

## 2019-03-26 DIAGNOSIS — R059 Cough, unspecified: Secondary | ICD-10-CM

## 2019-03-26 MED ORDER — MECLIZINE HCL 12.5 MG PO TABS
12.5000 mg | ORAL_TABLET | Freq: Three times a day (TID) | ORAL | 0 refills | Status: DC | PRN
Start: 2019-03-26 — End: 2020-06-24

## 2019-03-26 MED ORDER — HYDROCODONE-HOMATROPINE 5-1.5 MG/5ML PO SYRP
5.0000 mL | ORAL_SOLUTION | Freq: Four times a day (QID) | ORAL | 0 refills | Status: AC | PRN
Start: 2019-03-26 — End: 2019-04-05

## 2019-03-26 NOTE — Progress Notes (Signed)
Date:  03/26/2019   Name:  Nancy Vega   DOB:  [birth year]   MRN:  [id number]   Chief Complaint: No chief complaint on file.  Cough  This is a new problem. The current episode started 1 to 4 weeks ago. The problem has been unchanged. The problem occurs every few minutes. The cough is non-productive. Associated symptoms include headaches and a sore throat. Pertinent negatives include no chest pain, chills, fever, shortness of breath or wheezing. The symptoms are aggravated by exercise (talking but not by lying down). She has tried OTC cough suppressant, oral steroids, a beta-agonist inhaler and rest (Steroid taper last week - no help.  UC three days ago - tessalon perles) for the symptoms. The treatment provided no relief. Her past medical history is significant for asthma, bronchitis and environmental allergies.  Dizziness  This is a new problem. The current episode started today. The problem occurs constantly. The problem has been unchanged. Associated symptoms include coughing, fatigue, headaches, nausea, a sore throat, vertigo and vomiting (coughing so hard that she vomits). Pertinent negatives include no chest pain, chills or fever. The symptoms are aggravated by twisting and walking. She has tried nothing for the symptoms.    Review of Systems  Constitutional: Positive for fatigue. Negative for chills and fever.  HENT: Positive for sore throat.   Respiratory: Positive for cough. Negative for shortness of breath and wheezing.   Cardiovascular: Negative for chest pain, palpitations and leg swelling.  Gastrointestinal: Positive for nausea and vomiting (coughing so hard that she vomits).  Allergic/Immunologic: Positive for environmental allergies.  Neurological: Positive for dizziness, vertigo, tremors and headaches.    Patient Active Problem List   Diagnosis Date Noted  . Bronchospasm with bronchitis, acute 10/22/2018  . Chronic bronchitis, simple ([place]) 07/18/2018  . Dense  breast tissue 03/21/2016  . Anxiety disorder 07/13/2015  . Asthma in adult, mild intermittent, uncomplicated 03/50/0938  . Essential tremor 07/13/2015  . Irritable bowel syndrome with constipation 07/13/2015    Allergies  Allergen Reactions  . Doxycycline Nausea And Vomiting  . Amoxicillin Hives  . Cefdinir Other (See Comments)  . Iodine Solution [Povidone Iodine]     Allergy to cleaning Iodine solution   . Nsaids Other (See Comments)    Tremor intensity  . Tamiflu [Oseltamivir] Hives  . Zithromax [Azithromycin] Hives and Other (See Comments)  . Buspirone Other (See Comments)    Dizziness and fatigue    Past Surgical History:  Procedure Laterality Date  . APPENDECTOMY  06/2015  . BREAST EXCISIONAL BIOPSY Right 2016   neg  . BREAST EXCISIONAL BIOPSY Left 04/2016   fibroadenoma/ us/bx - clip-   . BREAST LUMPECTOMY Right 2012   neg  . KNEE SURGERY Right 2011  . TONSILLECTOMY Bilateral     Social History   Tobacco Use  . Smoking status: Never Smoker  . Smokeless tobacco: Never Used  Substance Use Topics  . Alcohol use: Yes    Alcohol/week: 2.0 standard drinks    Types: 2 Standard drinks or equivalent per week    Comment: occassion  . Drug use: No     Medication list has been reviewed and updated.  No outpatient medications have been marked as taking for the 03/26/19 encounter (Office Visit) with Glean Hess, MD.    [facility] 2/9 Scores 03/18/2019 10/22/2018 09/08/2017  PHQ - 2 Score 1 0 0  PHQ- 9 Score 4 - -    BP Readings from Last 3 Encounters:  03/26/19 130/80  11/29/18 112/70  11/06/18 112/60    Physical Exam Constitutional:      Appearance: She is ill-appearing.  HENT:     Right Ear: Tympanic membrane and ear canal normal.     Left Ear: Tympanic membrane and ear canal normal.     Mouth/Throat:     Pharynx: No oropharyngeal exudate, posterior oropharyngeal erythema or uvula swelling.  Eyes:     Extraocular Movements:     Right eye: Nystagmus  present.     Left eye: Nystagmus present.     Pupils: Pupils are equal, round, and reactive to light.  Neck:     Musculoskeletal: Normal range of motion and neck supple.  Cardiovascular:     Rate and Rhythm: Tachycardia present.     Pulses: Normal pulses.     Heart sounds: No murmur.  Pulmonary:     Effort: Pulmonary effort is normal. No respiratory distress.     Breath sounds: No wheezing or rales.  Lymphadenopathy:     Cervical: No cervical adenopathy.  Neurological:     Mental Status: She is alert.  Psychiatric:        Attention and Perception: Attention normal.        Thought Content: Thought content normal.        Cognition and Memory: Cognition normal.     Wt Readings from Last 3 Encounters:  03/26/19 145 lb (65.8 kg)  03/18/19 145 lb (65.8 kg)  11/29/18 143 lb (64.9 kg)   No orthostatic BP changes 122/82 sitting; 120/80 supine BP 130/80   Pulse (!) 112   Temp 98.5 F (36.9 C) (Oral)   Ht 5\' 2"  (1.575 m)   Wt 145 lb (65.8 kg)   SpO2 98%   BMI 26.52 kg/m   Assessment and Plan: 1. Vertigo Keep up fluids; use antivert to calm severe symptoms Should improve over the next week - meclizine (ANTIVERT) 12.5 MG tablet; Take 1 tablet (12.5 mg total) by mouth 3 (three) times daily as needed for dizziness.  Dispense: 30 tablet; Refill: 0  2. Cough Will give hycodan syrup to use qid PRN No evidence of viral or bacterial etiology - HYDROcodone-homatropine (HYCODAN) 5-1.5 MG/5ML syrup; Take 5 mLs by mouth every 6 (six) hours as needed for up to 10 days for cough.  Dispense: 120 mL; Refill: 0  3. Chronic bronchitis, simple ([place]) - Ambulatory referral to Pulmonology   Partially dictated using Dragon software. Any errors are unintentional.  Halina Maidens, MD [facility]  03/26/2019

## 2019-03-26 NOTE — Patient Instructions (Signed)
This information is directly available on the CDC website: https://www.cdc.gov/coronavirus/2019-ncov/if-you-are-sick/steps-when-sick.html    Source:CDC Reference to specific commercial products, manufacturers, companies, or trademarks does not constitute its endorsement or recommendation by the U.S. Government, Department of Health and Human Services, or Centers for Disease Control and Prevention.  

## 2019-03-29 ENCOUNTER — Other Ambulatory Visit: Payer: Self-pay | Admitting: Internal Medicine

## 2019-03-29 ENCOUNTER — Encounter: Payer: Self-pay | Admitting: Internal Medicine

## 2019-03-29 DIAGNOSIS — J410 Simple chronic bronchitis: Secondary | ICD-10-CM

## 2019-03-29 MED ORDER — LEVOFLOXACIN 500 MG PO TABS: 500.0000 mg | ORAL_TABLET | Freq: Every day | ORAL | 0 refills | Status: AC

## 2019-03-29 NOTE — Telephone Encounter (Signed)
Please advise 

## 2019-03-29 NOTE — Telephone Encounter (Signed)
Patient response

## 2019-03-29 NOTE — Telephone Encounter (Signed)
Please Advise patient message.

## 2019-06-13 ENCOUNTER — Encounter: Payer: Self-pay | Admitting: Internal Medicine

## 2019-06-13 ENCOUNTER — Telehealth: Payer: Self-pay

## 2019-06-13 NOTE — Telephone Encounter (Signed)
Patient husband just wanted to inform us patient had a CT done with Pulmonologist and they found a growth on her thyroid.  Will be follow up with the pulmonologist doctor and she will my chart [place] as to what they want done by PCP.  FYI.

## 2019-06-14 ENCOUNTER — Encounter: Payer: Self-pay | Admitting: Internal Medicine

## 2019-06-14 NOTE — Telephone Encounter (Signed)
Please Advise. CT Results attached in image.

## 2019-06-14 NOTE — Telephone Encounter (Signed)
Patient response with another picture. Please advise.

## 2019-06-17 ENCOUNTER — Other Ambulatory Visit: Payer: Self-pay | Admitting: Internal Medicine

## 2019-06-17 DIAGNOSIS — E041 Nontoxic single thyroid nodule: Secondary | ICD-10-CM | POA: Insufficient documentation

## 2019-06-18 ENCOUNTER — Other Ambulatory Visit: Payer: Self-pay | Admitting: Obstetrics & Gynecology

## 2019-06-18 DIAGNOSIS — Z1231 Encounter for screening mammogram for malignant neoplasm of breast: Secondary | ICD-10-CM

## 2019-06-20 ENCOUNTER — Ambulatory Visit
Admission: RE | Admit: 2019-06-20 | Discharge: 2019-06-20 | Disposition: A | Discharge status: Home / Self Care | Payer: BLUE CROSS/BLUE SHIELD | Source: Ambulatory Visit | Attending: Internal Medicine | Admitting: Internal Medicine

## 2019-06-20 ENCOUNTER — Other Ambulatory Visit: Payer: Self-pay

## 2019-06-20 DIAGNOSIS — E041 Nontoxic single thyroid nodule: Secondary | ICD-10-CM | POA: Insufficient documentation

## 2019-06-25 ENCOUNTER — Ambulatory Visit (INDEPENDENT_AMBULATORY_CARE_PROVIDER_SITE_OTHER): Payer: No Typology Code available for payment source | Admitting: Internal Medicine

## 2019-06-25 ENCOUNTER — Encounter: Payer: Self-pay | Admitting: Internal Medicine

## 2019-06-25 ENCOUNTER — Other Ambulatory Visit: Payer: Self-pay

## 2019-06-25 VITALS — BP 124/70 | HR 89 | Ht 62.0 in | Wt 155.0 lb

## 2019-06-25 DIAGNOSIS — E041 Nontoxic single thyroid nodule: Secondary | ICD-10-CM | POA: Diagnosis not present

## 2019-06-25 DIAGNOSIS — F321 Major depressive disorder, single episode, moderate: Secondary | ICD-10-CM

## 2019-06-25 DIAGNOSIS — Z0000 Encounter for general adult medical examination without abnormal findings: Secondary | ICD-10-CM | POA: Diagnosis not present

## 2019-06-25 DIAGNOSIS — J4520 Mild intermittent asthma, uncomplicated: Secondary | ICD-10-CM

## 2019-06-25 DIAGNOSIS — K581 Irritable bowel syndrome with constipation: Secondary | ICD-10-CM | POA: Diagnosis not present

## 2019-06-25 DIAGNOSIS — G250 Essential tremor: Secondary | ICD-10-CM

## 2019-06-25 LAB — POCT URINALYSIS DIPSTICK
Bilirubin, UA: NEGATIVE
Blood, UA: NEGATIVE
Glucose, UA: NEGATIVE
Ketones, UA: NEGATIVE
Leukocytes, UA: NEGATIVE
Nitrite, UA: NEGATIVE
Protein, UA: NEGATIVE
Spec Grav, UA: 1.015 (ref 1.010–1.025)
Urobilinogen, UA: 0.2 E.U./dL
pH, UA: 7 (ref 5.0–8.0)

## 2019-06-25 MED ORDER — SERTRALINE HCL 50 MG PO TABS: 50.0000 mg | ORAL_TABLET | Freq: Every day | ORAL | 3 refills | Status: DC

## 2019-06-25 NOTE — Progress Notes (Signed)
Date:  06/25/2019   Name:  Nancy Vega   DOB:  Jul 02, 1976   MRN:  [id number]   Chief Complaint: Annual Exam ([place] in another state.) and Depression (22- PHQ9) Nancy Vega is a 42 y.o. female who presents today for her Complete Annual Exam. She feels poorly - very depressed. She reports exercising regularly. She reports she is sleeping poorly. She denies breast issues - will be seeing Dr. Leonides Schanz for routine GYN exam later this month. Mammogram scheduled.  Tdap 2014 Pap 11/2017 Mammogram 03/2018  Depression        This is a new problem.  The onset quality is gradual.   The problem occurs constantly.The problem is unchanged.  Associated symptoms include helplessness, hopelessness, insomnia, restlessness, decreased interest and sad.  Associated symptoms include no fatigue, no headaches and no suicidal ideas.  Treatments tried: bupropion. Asthma She complains of cough, hoarse voice, shortness of breath and wheezing. This is a chronic problem. The cough is non-productive. Pertinent negatives include no chest pain, fever, headaches or trouble swallowing. Her past medical history is significant for asthma. Past medical history comments: Recently seen by Pulmonary, started on Qvar.  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  Tremor - she is on primadone for control.  Still has mild tremor but it is manageable and does not interfere with daily acidities. Thyroid nodule - Thyroid US done.  2.1 cm left sided cyst that does not meet criteria for FNA.  She has no symptoms of compression or choking and does not see a fullness or mass.  The [place] was done because the thyroid was enlarged on a recent MRI chest.  Review of Systems  Constitutional: Negative for chills, fatigue and fever.  HENT: Positive for hoarse voice. Negative for congestion, hearing loss, tinnitus, trouble swallowing and voice change.   Eyes: Negative for visual disturbance.   Respiratory: Positive for cough, shortness of breath and wheezing. Negative for chest tightness.   Cardiovascular: Negative for chest pain, palpitations and leg swelling.  Gastrointestinal: Positive for constipation. Negative for abdominal pain, blood in stool, diarrhea and vomiting.  Endocrine: Negative for polydipsia and polyuria.  Genitourinary: Negative for dysuria, frequency, genital sores, menstrual problem, vaginal bleeding and vaginal discharge.  Musculoskeletal: Negative for arthralgias, gait problem and joint swelling.  Skin: Negative for color change and rash.  Neurological: Positive for tremors. Negative for dizziness, light-headedness and headaches.  Hematological: Negative for adenopathy. Does not bruise/bleed easily.  Psychiatric/Behavioral: Positive for depression, dysphoric mood and sleep disturbance. Negative for suicidal ideas. The patient has insomnia. The patient is not nervous/anxious.     Patient Active Problem List   Diagnosis Date Noted  . Left thyroid nodule 06/17/2019  . Bronchospasm with bronchitis, acute 10/22/2018  . Chronic bronchitis, simple ([place]) 07/18/2018  . Dense breast tissue 03/21/2016  . Anxiety disorder 07/13/2015  . Asthma in adult, mild intermittent, uncomplicated 42/87/6811  . Essential tremor 07/13/2015  . Irritable bowel syndrome with constipation 07/13/2015    Allergies  Allergen Reactions  . Codeine Nausea And Vomiting  . Doxycycline Nausea And Vomiting  . Amoxicillin Hives  . Cefdinir Other (See Comments)  . Iodine Solution [Povidone Iodine]     Allergy to cleaning Iodine solution   . Nsaids Other (See Comments)    Tremor intensity  . Tamiflu [Oseltamivir] Hives  . Zithromax [Azithromycin] Hives and Other (See Comments)  . Buspirone Other (See Comments)    Dizziness and fatigue  Past Surgical History:  Procedure Laterality Date  . APPENDECTOMY  06/2015  . BREAST EXCISIONAL BIOPSY Right 2016   neg  . BREAST EXCISIONAL  BIOPSY Left 04/2016   fibroadenoma/ us/bx - clip-   . BREAST LUMPECTOMY Right 2012   neg  . KNEE SURGERY Right 2011  . TONSILLECTOMY Bilateral     Social History   Tobacco Use  . Smoking status: Never Smoker  . Smokeless tobacco: Never Used  Substance Use Topics  . Alcohol use: Yes    Alcohol/week: 2.0 standard drinks    Types: 2 Standard drinks or equivalent per week    Comment: occassion  . Drug use: No     Medication list has been reviewed and updated.  Current Meds  Medication Sig  . albuterol (PROAIR HFA) 108 (90 Base) MCG/ACT inhaler Inhale 2 puffs into the lungs every 6 (six) hours as needed for wheezing or shortness of breath.  . ALPRAZolam (XANAX) 0.5 MG tablet Take 1 tablet by mouth 2 (two) times daily as needed.  . beclomethasone (QVAR) 80 MCG/ACT inhaler Inhale 2 puffs into the lungs 2 (two) times daily.  Marland Kitchen buPROPion (WELLBUTRIN XL) 150 MG 24 hr tablet Take 1 tablet (150 mg total) by mouth daily.  . fluticasone (FLONASE) 50 MCG/ACT nasal spray Place 2 sprays into both nostrils daily.  Marland Kitchen levonorgestrel (MIRENA) 20 MCG/24HR IUD 1 each by Intrauterine route once.  . meclizine (ANTIVERT) 12.5 MG tablet Take 1 tablet (12.5 mg total) by mouth 3 (three) times daily as needed for dizziness.  . primidone (MYSOLINE) 50 MG tablet Take 100 mg by mouth 2 (two) times daily.     PHQ 2/9 Scores 06/25/2019 03/18/2019 10/22/2018 09/08/2017  PHQ - 2 Score 4 1 0 0  PHQ- 9 Score 22 4 - -    BP Readings from Last 3 Encounters:  03/26/19 130/80  11/29/18 112/70  11/06/18 112/60    Physical Exam Vitals signs and nursing note reviewed.  Constitutional:      General: She is not in acute distress.    Appearance: She is well-developed.  HENT:     Head: Normocephalic and atraumatic.     Right Ear: Tympanic membrane and ear canal normal.     Left Ear: Tympanic membrane and ear canal normal.     Nose:     Right Sinus: No maxillary sinus tenderness.     Left Sinus: No maxillary sinus  tenderness.     Mouth/Throat:     Pharynx: Uvula midline.  Eyes:     General: No scleral icterus.       Right eye: No discharge.        Left eye: No discharge.     Conjunctiva/sclera: Conjunctivae normal.  Neck:     Musculoskeletal: Normal range of motion. No erythema.     Thyroid: No thyromegaly.     Vascular: No carotid bruit.  Cardiovascular:     Rate and Rhythm: Normal rate and regular rhythm.     Pulses: Normal pulses.     Heart sounds: Normal heart sounds. No murmur.  Pulmonary:     Effort: Pulmonary effort is normal. No respiratory distress.     Breath sounds: No wheezing.  Abdominal:     General: Bowel sounds are normal.     Palpations: Abdomen is soft.     Tenderness: There is no abdominal tenderness.  Musculoskeletal: Normal range of motion.     Right lower leg: No edema.     Left  lower leg: No edema.  Lymphadenopathy:     Cervical: No cervical adenopathy.  Skin:    General: Skin is warm and dry.     Capillary Refill: Capillary refill takes less than 2 seconds.     Findings: No rash.  Neurological:     General: No focal deficit present.     Mental Status: She is alert and oriented to person, place, and time.     Cranial Nerves: No cranial nerve deficit.     Sensory: No sensory deficit.     Motor: Tremor (fine tremor of both hands) present.     Deep Tendon Reflexes: Reflexes are normal and symmetric.  Psychiatric:        Attention and Perception: Attention normal.        Mood and Affect: Mood is depressed. Affect is tearful.        Speech: Speech normal.        Behavior: Behavior normal.        Thought Content: Thought content normal. Thought content does not include suicidal ideation. Thought content does not include suicidal plan.        Cognition and Memory: Cognition normal.        Judgment: Judgment normal.     Wt Readings from Last 3 Encounters:  06/25/19 155 lb (70.3 kg)  03/26/19 145 lb (65.8 kg)  03/18/19 145 lb (65.8 kg)    Ht 5\' 2"  (1.575 m)    Wt 155 lb (70.3 kg)   LMP  (Exact Date) Comment: 12/19/2018- IUD placed  BMI 28.35 kg/m   Assessment and Plan: 1. Annual physical exam Normal exam  Continue healthy diet, exercise Mammogram yearly by GYN Pap every 3-5 years per guidelines - CBC with Differential/Platelet - Comprehensive metabolic panel - Lipid panel - POCT urinalysis dipstick  2. Current moderate episode of major depressive disorder without prior episode ([place]) Need to start medication - zoloft may also help with constipation Follow up in one month - sertraline (ZOLOFT) 50 MG tablet; Take 1 tablet (50 mg total) by mouth daily.  Dispense: 30 tablet; Refill: 3  3. Asthma in adult, mild intermittent, uncomplicated Now on Qvar Follow up with Pulmonary  4. Irritable bowel syndrome with constipation Did not tolerate even the lowest dose of Linzess every three days Continue fiber, fluids  5. Left thyroid nodule Cyst does not meed criteria for FNA; will monitor yearly or if sx occur - Thyroid Panel With TSH  6. Essential tremor Fair control with primidone - managed by Neurology   Partially dictated using Dragon software. Any errors are unintentional.  Halina Maidens, MD [facility]  06/25/2019

## 2019-06-25 NOTE — Patient Instructions (Signed)
Suicidal Feelings: How to Help Yourself Suicide is when you end your own life. There are many things you can do to help yourself feel better when struggling with these feelings. Many services and people are available to support you and others who struggle with similar feelings.  If you ever feel like you may hurt yourself or others, or have thoughts about taking your own life, get help right away. To get help:  Call your local emergency services (911 in the [place]  The [employer]'s health and human services helpline (211 in the [place]  Go to your nearest emergency department.  Call a suicide hotline to speak with a trained counselor. The following suicide hotlines are available in the [place]: ? 1-800-273-TALK (1-800-273-8255). ? 1-800-SUICIDE (1-800-784-2433). ? 1-888-628-9454. This is a hotline for Spanish speakers. ? 1-800-799-4889. This is a hotline for TTY users. ? 1-866-4-U-TREVOR (1-866-488-7386). This is a hotline for lesbian, gay, bisexual, transgender, or questioning youth. ? For a list of hotlines in [place], visit www.suicide.org/hotlines/international/[place]-suicide-hotlines.html  Contact a crisis center or a local suicide prevention center. To find a crisis center or suicide prevention center: ? Call your local hospital, clinic, community service organization, mental health center, social service provider, or health department. Ask for help with connecting to a crisis center. ? For a list of crisis centers in the [place], visit: suicidepreventionlifeline.org ? For a list of crisis centers in [place], visit: suicideprevention.ca How to help yourself feel better   Promise yourself that you will not do anything extreme when you have suicidal feelings. Remember, there is hope. Many people have gotten through suicidal thoughts and feelings, and you can too. If you have had these feelings before, remind yourself that you can get through them again.  Let family, friends,  teachers, or counselors know how you are feeling. Try not to separate yourself from those who care about you and want to help you. Talk with someone every day, even if you do not feel sociable. Face-to-face conversation is best to help them understand your feelings.  Contact a mental health care provider and work with this person regularly.  Make a safety plan that you can follow during a crisis. Include phone numbers of suicide prevention hotlines, mental health professionals, and trusted friends and family members you can call during an emergency. Save these numbers on your phone.  If you are thinking of taking a lot of medicine, give your medicine to someone who can give it to you as prescribed. If you are on antidepressants and are concerned you will overdose, tell your health care provider so that he or she can give you safer medicines.  Try to stick to your routines. Follow a schedule every day. Make self-care a priority.  Make a list of realistic goals, and cross them off when you achieve them. Accomplishments can give you a sense of worth.  Wait until you are feeling better before doing things that you find difficult or unpleasant.  Do things that you have always enjoyed to take your mind off your feelings. Try reading a book, or listening to or playing music. Spending time outside, in nature, may help you feel better. Follow these instructions at home:   Visit your primary health care provider every year for a checkup.  Work with a mental health care provider as needed.  Eat a well-balanced diet, and eat regular meals.  Get plenty of rest.  Exercise if you are able. Just 30 minutes of exercise each day can   help you feel better.  Take over-the-counter and prescription medicines only as told by your health care provider. Ask your mental health care provider about the possible side effects of any medicines you are taking.  Do not use alcohol or drugs, and remove these substances  from your home.  Remove weapons, poisons, knives, and other deadly items from your home. General recommendations  Keep your living space well lit.  When you are feeling well, write yourself a letter with tips and support that you can read when you are not feeling well.  Remember that life's difficulties can be sorted out with help. Conditions can be treated, and you can learn behaviors and ways of thinking that will help you. Where to find more information  National Suicide Prevention Lifeline: www.suicidepreventionlifeline.org  Hopeline: www.hopeline.[place] for Suicide Prevention: PromotionalLoans.co.za  [employer] (for lesbian, gay, bisexual, transgender, or questioning youth): www.thetrevorproject.org Contact a health care provider if:  You feel as though you are a burden to others.  You feel agitated, angry, vengeful, or have extreme mood swings.  You have withdrawn from family and friends. Get help right away if:  You are talking about suicide or wishing to die.  You start making plans for how to commit suicide.  You feel that you have no reason to live.  You start making plans for putting your affairs in order, saying goodbye, or giving your possessions away.  You feel guilt, shame, or unbearable pain, and it seems like there is no way out.  You are frequently using drugs or alcohol.  You are engaging in risky behaviors that could lead to death. If you have any of these symptoms, get help right away. Call emergency services, go to your nearest emergency department or crisis center, or call a suicide crisis helpline. Summary  Suicide is when you take your own life.  Promise yourself that you will not do anything extreme when you have suicidal feelings.  Let family, friends, teachers, or counselors know how you are feeling.  Get help right away if you feel as though life is getting too tough to handle and you are thinking about suicide. This  information is not intended to replace advice given to you by your health care provider. Make sure you discuss any questions you have with your health care provider. Document Released: 06/11/2003 Document Revised: 03/28/2019 Document Reviewed: 07/18/2017 Elsevier Patient Education  2020 [employer].

## 2019-06-26 LAB — COMPREHENSIVE METABOLIC PANEL
ALT: 20 IU/L (ref 0–32)
AST: 18 IU/L (ref 0–40)
Albumin/Globulin Ratio: 2 (ref 1.2–2.2)
Albumin: 4.8 g/dL (ref 3.8–4.8)
Alkaline Phosphatase: 94 IU/L (ref 39–117)
BUN/Creatinine Ratio: 10 (ref 9–23)
BUN: 9 mg/dL (ref 6–24)
Bilirubin Total: 1.2 mg/dL (ref 0.0–1.2)
CO2: 24 mmol/L (ref 20–29)
Calcium: 9.6 mg/dL (ref 8.7–10.2)
Chloride: 100 mmol/L (ref 96–106)
Creatinine, Ser: 0.91 mg/dL (ref 0.57–1.00)
GFR calc Af Amer: 90 mL/min/{1.73_m2} (ref >59)
GFR calc non Af Amer: 78 mL/min/{1.73_m2} (ref >59)
Globulin, Total: 2.4 g/dL (ref 1.5–4.5)
Glucose: 91 mg/dL (ref 65–99)
Potassium: 3.9 mmol/L (ref 3.5–5.2)
Sodium: 139 mmol/L (ref 134–144)
Total Protein: 7.2 g/dL (ref 6.0–8.5)

## 2019-06-26 LAB — LIPID PANEL
Chol/HDL Ratio: 3.1 ratio (ref 0.0–4.4)
Cholesterol, Total: 205 mg/dL — ABNORMAL HIGH (ref 100–199)
HDL: 67 mg/dL (ref >39)
LDL Calculated: 110 mg/dL — ABNORMAL HIGH (ref 0–99)
Triglycerides: 141 mg/dL (ref 0–149)
VLDL Cholesterol Cal: 28 mg/dL (ref 5–40)

## 2019-06-26 LAB — CBC WITH DIFFERENTIAL/PLATELET
Basophils Absolute: 0.1 10*3/uL (ref 0.0–0.2)
Basos: 1 %
EOS (ABSOLUTE): 0 10*3/uL (ref 0.0–0.4)
Eos: 0 %
Hematocrit: 42.7 % (ref 34.0–46.6)
Hemoglobin: 14.5 g/dL (ref 11.1–15.9)
Immature Grans (Abs): 0 10*3/uL (ref 0.0–0.1)
Immature Granulocytes: 0 %
Lymphocytes Absolute: 2.2 10*3/uL (ref 0.7–3.1)
Lymphs: 21 %
MCH: 31.9 pg (ref 26.6–33.0)
MCHC: 34 g/dL (ref 31.5–35.7)
MCV: 94 fL (ref 79–97)
Monocytes Absolute: 0.6 10*3/uL (ref 0.1–0.9)
Monocytes: 6 %
Neutrophils Absolute: 7.8 10*3/uL — ABNORMAL HIGH (ref 1.4–7.0)
Neutrophils: 72 %
Platelets: 307 10*3/uL (ref 150–450)
RBC: 4.55 x10E6/uL (ref 3.77–5.28)
RDW: 11.9 % (ref 11.7–15.4)
WBC: 10.8 10*3/uL (ref 3.4–10.8)

## 2019-06-26 LAB — THYROID PANEL WITH TSH
Free Thyroxine Index: 1.7 (ref 1.2–4.9)
T3 Uptake Ratio: 23 % — ABNORMAL LOW (ref 24–39)
T4, Total: 7.5 ug/dL (ref 4.5–12.0)
TSH: 2.63 u[IU]/mL (ref 0.450–4.500)

## 2019-07-15 ENCOUNTER — Ambulatory Visit: Payer: No Typology Code available for payment source

## 2019-07-25 ENCOUNTER — Other Ambulatory Visit: Payer: Self-pay | Admitting: Obstetrics & Gynecology

## 2019-07-25 DIAGNOSIS — Z1231 Encounter for screening mammogram for malignant neoplasm of breast: Secondary | ICD-10-CM

## 2019-07-25 LAB — HM PAP SMEAR: HM Pap smear: NORMAL

## 2019-08-12 ENCOUNTER — Ambulatory Visit (INDEPENDENT_AMBULATORY_CARE_PROVIDER_SITE_OTHER): Payer: No Typology Code available for payment source | Admitting: Internal Medicine

## 2019-08-12 ENCOUNTER — Other Ambulatory Visit: Payer: Self-pay

## 2019-08-12 ENCOUNTER — Encounter: Payer: Self-pay | Admitting: Internal Medicine

## 2019-08-12 VITALS — BP 124/78 | HR 76 | Ht 62.0 in | Wt 149.0 lb

## 2019-08-12 DIAGNOSIS — N6011 Diffuse cystic mastopathy of right breast: Secondary | ICD-10-CM

## 2019-08-12 DIAGNOSIS — N6012 Diffuse cystic mastopathy of left breast: Secondary | ICD-10-CM | POA: Diagnosis not present

## 2019-08-12 DIAGNOSIS — S83511A Sprain of anterior cruciate ligament of right knee, initial encounter: Secondary | ICD-10-CM

## 2019-08-12 DIAGNOSIS — F321 Major depressive disorder, single episode, moderate: Secondary | ICD-10-CM | POA: Diagnosis not present

## 2019-08-12 HISTORY — DX: Sprain of anterior cruciate ligament of right knee, initial encounter: S83.511A

## 2019-08-12 MED ORDER — SERTRALINE HCL 100 MG PO TABS: 100.0000 mg | ORAL_TABLET | Freq: Every day | ORAL | 1 refills | Status: DC

## 2019-08-12 NOTE — Progress Notes (Signed)
Date:  08/12/2019   Name:  Nancy Vega   DOB:  06-13-1976   MRN:  [id number]   Chief Complaint: Depression (Follow up. PHQ9- 11 GAD7- 10)  Depression        This is a new problem.  The current episode started more than 1 month ago.   The problem has been gradually improving since onset.  Associated symptoms include no decreased concentration, no fatigue, no headaches and no suicidal ideas.  Past treatments include SSRIs - Selective serotonin reuptake inhibitors (zoloft 50 mg started last visit).  Compliance with treatment is good. She is having less days feeling down and hopeless with more energy, better concentration and less restlessness. She does seem to run down toward suppertime and wonders if she could have higher dose.  She has had absolutely no side effects other than more regular stools which makes her happy.  Review of Systems  Constitutional: Negative for chills, fatigue, fever and unexpected weight change.  Respiratory: Negative for cough, chest tightness and wheezing.   Cardiovascular: Negative for chest pain and leg swelling.  Gastrointestinal: Negative for diarrhea, nausea and vomiting.  Neurological: Negative for dizziness, light-headedness and headaches.  Psychiatric/Behavioral: Positive for depression and dysphoric mood. Negative for decreased concentration, sleep disturbance and suicidal ideas. The patient is not nervous/anxious.     Patient Active Problem List   Diagnosis Date Noted  . Current moderate episode of major depressive disorder without prior episode ([place]) 08/12/2019  . Left thyroid nodule 06/17/2019  . Bronchospasm with bronchitis, acute 10/22/2018  . Chronic bronchitis, simple ([place]) 07/18/2018  . Fibrocystic breast changes, bilateral 03/21/2016  . Anxiety disorder 07/13/2015  . Asthma in adult, mild intermittent, uncomplicated [id number]  . Essential tremor 07/13/2015  . Irritable bowel syndrome with constipation 07/13/2015    Allergies   Allergen Reactions  . Codeine Nausea And Vomiting  . Doxycycline Nausea And Vomiting  . Amoxicillin Hives  . Cefdinir Other (See Comments)  . Iodine Solution [Povidone Iodine]     Allergy to cleaning Iodine solution   . Nsaids Other (See Comments)    Tremor intensity  . Tamiflu [Oseltamivir] Hives  . Zithromax [Azithromycin] Hives and Other (See Comments)  . Buspirone Other (See Comments)    Dizziness and fatigue    Past Surgical History:  Procedure Laterality Date  . APPENDECTOMY  06/2015  . BREAST BIOPSY Right 2012   neg  . BREAST EXCISIONAL BIOPSY Right 2016   neg  . BREAST EXCISIONAL BIOPSY Left 04/2016   fibroadenoma/ us/bx - clip-   . KNEE SURGERY Right 2011  . TONSILLECTOMY Bilateral     Social History   Tobacco Use  . Smoking status: Never Smoker  . Smokeless tobacco: Never Used  Substance Use Topics  . Alcohol use: Yes    Alcohol/week: 2.0 standard drinks    Types: 2 Standard drinks or equivalent per week    Comment: occassion  . Drug use: No     Medication list has been reviewed and updated.  Current Meds  Medication Sig  . albuterol (PROAIR HFA) 108 (90 Base) MCG/ACT inhaler Inhale 2 puffs into the lungs every 6 (six) hours as needed for wheezing or shortness of breath.  . ALPRAZolam (XANAX) 0.5 MG tablet Take 1 tablet by mouth 2 (two) times daily as needed.  . beclomethasone (QVAR) 80 MCG/ACT inhaler Inhale 2 puffs into the lungs 2 (two) times daily.  Marland Kitchen buPROPion (WELLBUTRIN XL) 150 MG 24 hr tablet Take 1 tablet (  150 mg total) by mouth daily.  . fluticasone (FLONASE) 50 MCG/ACT nasal spray Place 2 sprays into both nostrils daily.  Marland Kitchen levonorgestrel (MIRENA) 20 MCG/24HR IUD 1 each by Intrauterine route once.  . meclizine (ANTIVERT) 12.5 MG tablet Take 1 tablet (12.5 mg total) by mouth 3 (three) times daily as needed for dizziness.  . primidone (MYSOLINE) 50 MG tablet Take 100 mg by mouth 2 (two) times daily.   . sertraline (ZOLOFT) 50 MG tablet Take 1  tablet (50 mg total) by mouth daily.    PHQ 2/9 Scores 08/12/2019 06/25/2019 03/18/2019 10/22/2018  PHQ - 2 Score 3 4 1  0  PHQ- 9 Score 11 22 4  -   GAD 7 : Generalized Anxiety Score 08/12/2019 03/18/2019  Nervous, Anxious, on Edge 3 1  Control/stop worrying 3 3  Worry too much - different things 3 1  Trouble relaxing 0 0  Restless 0 0  Easily annoyed or irritable 1 0  Afraid - awful might happen 0 0  Total GAD 7 Score 10 5      BP Readings from Last 3 Encounters:  08/12/19 124/78  06/25/19 124/70  03/26/19 130/80    Physical Exam Vitals signs and nursing note reviewed.  Constitutional:      General: She is not in acute distress.    Appearance: She is well-developed.  HENT:     Head: Normocephalic and atraumatic.  Neck:     Musculoskeletal: Normal range of motion and neck supple.  Cardiovascular:     Rate and Rhythm: Normal rate and regular rhythm.     Pulses: Normal pulses.     Heart sounds: No murmur.  Pulmonary:     Effort: Pulmonary effort is normal. No respiratory distress.  Musculoskeletal: Normal range of motion.     Right lower leg: No edema.     Left lower leg: No edema.       Legs:  Skin:    General: Skin is warm and dry.     Capillary Refill: Capillary refill takes less than 2 seconds.     Findings: No rash.  Neurological:     General: No focal deficit present.     Mental Status: She is alert and oriented to person, place, and time.  Psychiatric:        Attention and Perception: Attention normal.        Mood and Affect: Mood normal.        Behavior: Behavior normal.        Thought Content: Thought content normal.     Wt Readings from Last 3 Encounters:  08/12/19 149 lb (67.6 kg)  06/25/19 155 lb (70.3 kg)  03/26/19 145 lb (65.8 kg)    BP 124/78   Pulse 76   Ht 5\' 2"  (1.575 m)   Wt 149 lb (67.6 kg)   SpO2 97%   BMI 27.25 kg/m   Assessment and Plan: 1. Current moderate episode of major depressive disorder without prior episode ([facility]) Much  improved on zoloft 50 mg with no adverse side effects Will increase to 100 mg  - follow up if needed - sertraline (ZOLOFT) 100 MG tablet; Take 1 tablet (100 mg total) by mouth daily.  Dispense: 90 tablet; Refill: 1  2. Fibrocystic breast changes, bilateral  3. Rupture of anterior cruciate ligament of right knee, initial encounter Wearing a brace today and has Ortho consultation in the near future   Partially dictated using Editor, commissioning. Any errors are unintentional.  Mickel Baas  Army Melia, [facility]  08/12/2019

## 2019-08-29 ENCOUNTER — Ambulatory Visit
Admission: RE | Admit: 2019-08-29 | Discharge: 2019-08-29 | Disposition: A | Discharge status: Home / Self Care | Payer: BLUE CROSS/BLUE SHIELD | Source: Ambulatory Visit | Attending: Obstetrics & Gynecology | Admitting: Obstetrics & Gynecology

## 2019-08-29 DIAGNOSIS — Z1231 Encounter for screening mammogram for malignant neoplasm of breast: Secondary | ICD-10-CM | POA: Diagnosis not present

## 2019-09-03 ENCOUNTER — Other Ambulatory Visit: Payer: Self-pay | Admitting: Obstetrics & Gynecology

## 2019-09-03 DIAGNOSIS — N6320 Unspecified lump in the left breast, unspecified quadrant: Secondary | ICD-10-CM

## 2019-09-03 DIAGNOSIS — R928 Other abnormal and inconclusive findings on diagnostic imaging of breast: Secondary | ICD-10-CM

## 2019-09-12 ENCOUNTER — Ambulatory Visit
Admission: RE | Admit: 2019-09-12 | Discharge: 2019-09-12 | Disposition: A | Discharge status: Home / Self Care | Payer: BLUE CROSS/BLUE SHIELD | Source: Ambulatory Visit | Attending: Obstetrics & Gynecology | Admitting: Obstetrics & Gynecology

## 2019-09-12 DIAGNOSIS — R928 Other abnormal and inconclusive findings on diagnostic imaging of breast: Secondary | ICD-10-CM

## 2019-09-12 DIAGNOSIS — N6320 Unspecified lump in the left breast, unspecified quadrant: Secondary | ICD-10-CM

## 2019-09-19 ENCOUNTER — Other Ambulatory Visit: Payer: Self-pay | Admitting: Internal Medicine

## 2019-09-19 DIAGNOSIS — F411 Generalized anxiety disorder: Secondary | ICD-10-CM

## 2019-09-23 DIAGNOSIS — M25562 Pain in left knee: Secondary | ICD-10-CM | POA: Insufficient documentation

## 2019-09-23 DIAGNOSIS — M25361 Other instability, right knee: Secondary | ICD-10-CM | POA: Insufficient documentation

## 2019-09-27 ENCOUNTER — Encounter: Payer: Self-pay | Admitting: Internal Medicine

## 2019-09-27 NOTE — Telephone Encounter (Signed)
Please advise. Medication questions.

## 2019-10-07 DIAGNOSIS — M2351 Chronic instability of knee, right knee: Secondary | ICD-10-CM | POA: Insufficient documentation

## 2019-10-15 ENCOUNTER — Encounter: Payer: Self-pay | Admitting: Internal Medicine

## 2019-10-15 NOTE — Telephone Encounter (Signed)
Should patient still be nauseas everyday since starting medication a month ago?   Please advise.

## 2019-10-16 ENCOUNTER — Other Ambulatory Visit: Payer: Self-pay | Admitting: Internal Medicine

## 2019-10-16 MED ORDER — ONDANSETRON HCL 4 MG PO TABS: 4.0000 mg | ORAL_TABLET | Freq: Three times a day (TID) | ORAL | 0 refills | Status: DC | PRN

## 2020-02-24 DIAGNOSIS — Z9889 Other specified postprocedural states: Secondary | ICD-10-CM | POA: Insufficient documentation

## 2020-06-24 ENCOUNTER — Encounter: Payer: Self-pay | Admitting: Internal Medicine

## 2020-06-24 ENCOUNTER — Other Ambulatory Visit: Payer: Self-pay | Admitting: Internal Medicine

## 2020-06-25 ENCOUNTER — Ambulatory Visit (INDEPENDENT_AMBULATORY_CARE_PROVIDER_SITE_OTHER): Payer: No Typology Code available for payment source | Admitting: Internal Medicine

## 2020-06-25 ENCOUNTER — Encounter: Payer: Self-pay | Admitting: Internal Medicine

## 2020-06-25 ENCOUNTER — Other Ambulatory Visit: Payer: Self-pay

## 2020-06-25 VITALS — BP 112/82 | HR 87 | Temp 98.1°F | Ht 62.0 in | Wt 165.0 lb

## 2020-06-25 DIAGNOSIS — Z0000 Encounter for general adult medical examination without abnormal findings: Secondary | ICD-10-CM

## 2020-06-25 DIAGNOSIS — Z1231 Encounter for screening mammogram for malignant neoplasm of breast: Secondary | ICD-10-CM

## 2020-06-25 DIAGNOSIS — Z1159 Encounter for screening for other viral diseases: Secondary | ICD-10-CM | POA: Diagnosis not present

## 2020-06-25 DIAGNOSIS — F321 Major depressive disorder, single episode, moderate: Secondary | ICD-10-CM

## 2020-06-25 LAB — POCT URINALYSIS DIPSTICK
Bilirubin, UA: NEGATIVE
Blood, UA: NEGATIVE
Glucose, UA: NEGATIVE
Ketones, UA: NEGATIVE
Leukocytes, UA: NEGATIVE
Nitrite, UA: NEGATIVE
Protein, UA: NEGATIVE
Spec Grav, UA: 1.01 (ref 1.010–1.025)
Urobilinogen, UA: 0.2 E.U./dL
pH, UA: 6 (ref 5.0–8.0)

## 2020-06-25 MED ORDER — ESCITALOPRAM OXALATE 10 MG PO TABS: 10.0000 mg | ORAL_TABLET | Freq: Every day | ORAL | 1 refills | Status: DC

## 2020-06-25 NOTE — Progress Notes (Signed)
Date:  06/25/2020   Name:  Nancy Vega   DOB:  Nov 20, 1976   MRN:  [id number]   Chief Complaint: Annual Exam (no breast exam(ob/gyn) no pap)  Nancy Vega is a 44 y.o. female who presents today for her Complete Annual Exam. She feels well. She reports exercising walk 2 miles whenever she can. She reports she is sleeping well. Breast complaints none. She is concerned about some weight gain.  Mammogram: 08/2019 GYN; wants to see her GYN in [place] next week then have me order mammogram at [facility] Pap smear: 11/2017 GYN Colonoscopy: none Immunization History  Administered Date(s) Administered   HPV 9-valent 09/09/2019, 11/18/2019, 02/21/2020   PFIZER SARS-COV-2 Vaccination 03/07/2020, 03/28/2020   Tdap 12/19/2012, 10/02/2013    Depression        This is a chronic problem.  The problem has been gradually worsening since onset.  Associated symptoms include irritable, decreased interest and sad.  Associated symptoms include no fatigue, no headaches and no suicidal ideas.     The symptoms are aggravated by nothing.  Treatments tried: currently on bupropion.  Compliance with treatment is good.  Previous treatment provided mild relief.   Lab Results  Component Value Date   CREATININE 0.91 06/25/2019   BUN 9 06/25/2019   NA 139 06/25/2019   K 3.9 06/25/2019   CL 100 06/25/2019   CO2 24 06/25/2019   Lab Results  Component Value Date   CHOL 205 (H) 06/25/2019   HDL 67 06/25/2019   LDLCALC 110 (H) 06/25/2019   TRIG 141 06/25/2019   CHOLHDL 3.1 06/25/2019   Lab Results  Component Value Date   TSH 2.630 06/25/2019   No results found for: HGBA1C Lab Results  Component Value Date   WBC 10.8 06/25/2019   HGB 14.5 06/25/2019   HCT 42.7 06/25/2019   MCV 94 06/25/2019   PLT 307 06/25/2019   Lab Results  Component Value Date   ALT 20 06/25/2019   AST 18 06/25/2019   ALKPHOS 94 06/25/2019   BILITOT 1.2 06/25/2019     Review of Systems  Constitutional: Negative  for chills, fatigue and fever.  HENT: Negative for congestion, hearing loss, tinnitus, trouble swallowing and voice change.   Eyes: Negative for visual disturbance.  Respiratory: Positive for cough (taking care by a pulm.), shortness of breath (taking care of by pulm.) and wheezing (taking care of by pulm.). Negative for chest tightness.   Cardiovascular: Negative for chest pain, palpitations and leg swelling.  Gastrointestinal: Positive for constipation (has IBS) and diarrhea (has IBS). Negative for abdominal pain and vomiting.  Endocrine: Negative for polydipsia and polyuria.  Genitourinary: Negative for dysuria, frequency, genital sores, vaginal bleeding and vaginal discharge.  Musculoskeletal: Negative for arthralgias, gait problem and joint swelling.  Skin: Negative for color change and rash.  Neurological: Negative for dizziness, tremors, light-headedness and headaches.  Hematological: Negative for adenopathy. Does not bruise/bleed easily.  Psychiatric/Behavioral: Positive for depression and dysphoric mood. Negative for sleep disturbance and suicidal ideas. The patient is nervous/anxious.     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, right 10/07/2019   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   Chronic pain of both knees 09/23/2019   Current moderate episode of major depressive disorder without prior episode ([place]) 08/12/2019   Rupture of anterior cruciate ligament of right knee 08/12/2019   Left thyroid nodule 06/17/2019   Bronchospasm  with bronchitis, acute 10/22/2018   Chronic bronchitis, simple ([place]) 07/18/2018   Fibrocystic breast changes, bilateral 03/21/2016   Anxiety disorder 07/13/2015   Asthma in adult, mild intermittent, uncomplicated 96/03/5408   Essential tremor 07/13/2015   Irritable bowel syndrome with constipation 07/13/2015    Allergies  Allergen Reactions   Codeine Nausea  And Vomiting   Doxycycline Nausea And Vomiting   Oxycodone Hives   Amoxicillin Hives   Cefdinir Other (See Comments)   Hydrocodone Other (See Comments)    syncope    Iodine Solution [Povidone Iodine]     Allergy to cleaning Iodine solution    Nsaids Other (See Comments)    Tremor intensity   Tamiflu [Oseltamivir] Hives   Zithromax [Azithromycin] Hives and Other (See Comments)   Buspirone Other (See Comments)    Dizziness and fatigue    Past Surgical History:  Procedure Laterality Date   APPENDECTOMY  06/2015   BREAST BIOPSY Left 04/2016   fibroadenoma/ us/bx   BREAST BIOPSY Right 2012   neg   BREAST EXCISIONAL BIOPSY Right 2016   neg   KNEE SURGERY Right 2011   TONSILLECTOMY Bilateral     Social History   Tobacco Use   Smoking status: Never Smoker   Smokeless tobacco: Never Used  Substance Use Topics   Alcohol use: Yes    Alcohol/week: 2.0 standard drinks    Types: 2 Standard drinks or equivalent per week    Comment: occassion   Drug use: No     Medication list has been reviewed and updated.  Current Meds  Medication Sig   ALPRAZolam (XANAX) 0.5 MG tablet Take 1 tablet by mouth 2 (two) times daily as needed.   buPROPion (WELLBUTRIN XL) 150 MG 24 hr tablet Take by mouth.   fluticasone (FLONASE) 50 MCG/ACT nasal spray Place 2 sprays into both nostrils daily.   fluticasone furoate-vilanterol (BREO ELLIPTA) 100-25 MCG/INH AEPB Inhale into the lungs.   levonorgestrel (MIRENA) 20 MCG/24HR IUD 1 each by Intrauterine route once.   primidone (MYSOLINE) 50 MG tablet Take 100 mg by mouth 2 (two) times daily.    tretinoin (RETIN-A) 0.05 % cream Apply topically.    PHQ 2/9 Scores 06/25/2020 08/12/2019 06/25/2019 03/18/2019  PHQ - 2 Score 2 3 4 1   PHQ- 9 Score 8 11 22 4     GAD 7 : Generalized Anxiety Score 06/25/2020 08/12/2019 03/18/2019  Nervous, Anxious, on Edge 1 3 1   Control/stop worrying 3 3 3   Worry too much - different things 3 3 1   Trouble  relaxing 2 0 0  Restless 0 0 0  Easily annoyed or irritable 0 1 0  Afraid - awful might happen 0 0 0  Total GAD 7 Score 9 10 5   Anxiety Difficulty Not difficult at all - -    BP Readings from Last 3 Encounters:  06/25/20 112/82  08/12/19 124/78  06/25/19 124/70    Physical Exam Vitals and nursing note reviewed.  Constitutional:      General: She is irritable. She is not in acute distress.    Appearance: She is well-developed.  HENT:     Head: Normocephalic and atraumatic.     Right Ear: Tympanic membrane and ear canal normal.     Left Ear: Tympanic membrane and ear canal normal.     Nose:     Right Sinus: No maxillary sinus tenderness.     Left Sinus: No maxillary sinus tenderness.  Eyes:     General: No scleral  icterus.       Right eye: No discharge.        Left eye: No discharge.     Conjunctiva/sclera: Conjunctivae normal.  Neck:     Thyroid: No thyromegaly.     Vascular: No carotid bruit.  Cardiovascular:     Rate and Rhythm: Normal rate and regular rhythm.     Pulses: Normal pulses.     Heart sounds: Normal heart sounds.  Pulmonary:     Effort: Pulmonary effort is normal. No respiratory distress.     Breath sounds: No wheezing.  Abdominal:     General: Bowel sounds are normal.     Palpations: Abdomen is soft.     Tenderness: There is no abdominal tenderness.  Musculoskeletal:     Cervical back: Normal range of motion. No erythema.     Right lower leg: No edema.     Left lower leg: No edema.  Lymphadenopathy:     Cervical: No cervical adenopathy.  Skin:    General: Skin is warm and dry.     Findings: No rash.  Neurological:     Mental Status: She is alert and oriented to person, place, and time.     Cranial Nerves: No cranial nerve deficit.     Sensory: No sensory deficit.     Deep Tendon Reflexes: Reflexes are normal and symmetric.  Psychiatric:        Attention and Perception: Attention normal.        Mood and Affect: Mood normal. Affect is tearful.       Wt Readings from Last 3 Encounters:  06/25/20 165 lb (74.8 kg)  08/12/19 149 lb (67.6 kg)  06/25/19 155 lb (70.3 kg)    BP 112/82    Pulse 87    Temp 98.1 F (36.7 C) (Oral)    Ht 5\' 2"  (1.575 m)    Wt 165 lb (74.8 kg)    SpO2 97%    BMI 30.18 kg/m   Assessment and Plan: 1. Annual physical exam Normal exam Continue healthy diet, start regular exercise Consider weight loss programs such as [employer] or [employer] - CBC with Differential/Platelet - Comprehensive metabolic panel - Lipid panel - POCT urinalysis dipstick  2. Need for hepatitis C screening test - Hepatitis C antibody  3. Encounter for screening mammogram for breast cancer She will call next month with the specific orders to be placed  4. Current moderate episode of major depressive disorder without prior episode [facility]) Not doing as well as previously - no SI/HI.  Taking and tolerating bupropion well. Will add Lexapro Follow up in 6 weeks - TSH - escitalopram (LEXAPRO) 10 MG tablet; Take 1 tablet (10 mg total) by mouth daily.  Dispense: 30 tablet; Refill: 1   Partially dictated using Editor, commissioning. Any errors are unintentional.  Halina Maidens, MD [facility]  06/25/2020

## 2020-06-26 LAB — CBC WITH DIFFERENTIAL/PLATELET
Basophils Absolute: 0.1 10*3/uL (ref 0.0–0.2)
Basos: 1 %
EOS (ABSOLUTE): 0.1 10*3/uL (ref 0.0–0.4)
Eos: 1 %
Hematocrit: 40.7 % (ref 34.0–46.6)
Hemoglobin: 13.8 g/dL (ref 11.1–15.9)
Immature Grans (Abs): 0 10*3/uL (ref 0.0–0.1)
Immature Granulocytes: 0 %
Lymphocytes Absolute: 1.9 10*3/uL (ref 0.7–3.1)
Lymphs: 20 %
MCH: 31.9 pg (ref 26.6–33.0)
MCHC: 33.9 g/dL (ref 31.5–35.7)
MCV: 94 fL (ref 79–97)
Monocytes Absolute: 0.7 10*3/uL (ref 0.1–0.9)
Monocytes: 7 %
Neutrophils Absolute: 6.8 10*3/uL (ref 1.4–7.0)
Neutrophils: 71 %
Platelets: 309 10*3/uL (ref 150–450)
RBC: 4.32 x10E6/uL (ref 3.77–5.28)
RDW: 12.1 % (ref 11.7–15.4)
WBC: 9.4 10*3/uL (ref 3.4–10.8)

## 2020-06-26 LAB — COMPREHENSIVE METABOLIC PANEL
ALT: 77 IU/L — ABNORMAL HIGH (ref 0–32)
AST: 38 IU/L (ref 0–40)
Albumin/Globulin Ratio: 1.7 (ref 1.2–2.2)
Albumin: 4.5 g/dL (ref 3.8–4.8)
Alkaline Phosphatase: 129 IU/L — ABNORMAL HIGH (ref 48–121)
BUN/Creatinine Ratio: 15 (ref 9–23)
BUN: 14 mg/dL (ref 6–24)
Bilirubin Total: 0.7 mg/dL (ref 0.0–1.2)
CO2: 22 mmol/L (ref 20–29)
Calcium: 9.7 mg/dL (ref 8.7–10.2)
Chloride: 101 mmol/L (ref 96–106)
Creatinine, Ser: 0.94 mg/dL (ref 0.57–1.00)
GFR calc Af Amer: 86 mL/min/{1.73_m2} (ref >59)
GFR calc non Af Amer: 75 mL/min/{1.73_m2} (ref >59)
Globulin, Total: 2.6 g/dL (ref 1.5–4.5)
Glucose: 84 mg/dL (ref 65–99)
Potassium: 4.3 mmol/L (ref 3.5–5.2)
Sodium: 138 mmol/L (ref 134–144)
Total Protein: 7.1 g/dL (ref 6.0–8.5)

## 2020-06-26 LAB — HEPATITIS C ANTIBODY: Hep C Virus Ab: <0.1 s/co ratio (ref 0.0–0.9)

## 2020-06-26 LAB — TSH: TSH: 2.19 u[IU]/mL (ref 0.450–4.500)

## 2020-06-26 LAB — LIPID PANEL
Chol/HDL Ratio: 3.6 ratio (ref 0.0–4.4)
Cholesterol, Total: 253 mg/dL — ABNORMAL HIGH (ref 100–199)
HDL: 70 mg/dL (ref >39)
LDL Chol Calc (NIH): 161 mg/dL — ABNORMAL HIGH (ref 0–99)
Triglycerides: 125 mg/dL (ref 0–149)
VLDL Cholesterol Cal: 22 mg/dL (ref 5–40)

## 2020-07-06 ENCOUNTER — Other Ambulatory Visit: Payer: Self-pay | Admitting: Obstetrics & Gynecology

## 2020-07-06 DIAGNOSIS — Z1231 Encounter for screening mammogram for malignant neoplasm of breast: Secondary | ICD-10-CM

## 2020-07-13 ENCOUNTER — Encounter: Payer: Self-pay | Admitting: Internal Medicine

## 2020-07-13 ENCOUNTER — Other Ambulatory Visit: Payer: Self-pay | Admitting: Internal Medicine

## 2020-08-12 ENCOUNTER — Other Ambulatory Visit
Admission: RE | Admit: 2020-08-12 | Discharge: 2020-08-12 | Disposition: A | Discharge status: Home / Self Care | Payer: BLUE CROSS/BLUE SHIELD | Attending: Internal Medicine | Admitting: Internal Medicine

## 2020-08-12 ENCOUNTER — Other Ambulatory Visit: Payer: Self-pay

## 2020-08-12 DIAGNOSIS — Z23 Encounter for immunization: Secondary | ICD-10-CM | POA: Diagnosis not present

## 2020-08-16 LAB — QUANTIFERON-TB GOLD PLUS (RQFGPL)
QuantiFERON Mitogen Value: 7.78 IU/mL
QuantiFERON Nil Value: 0.02 IU/mL
QuantiFERON TB1 Ag Value: 0.04 IU/mL
QuantiFERON TB2 Ag Value: 0.02 IU/mL

## 2020-08-16 LAB — QUANTIFERON-TB GOLD PLUS: QuantiFERON-TB Gold Plus: NEGATIVE

## 2020-08-17 ENCOUNTER — Ambulatory Visit: Payer: No Typology Code available for payment source | Admitting: Internal Medicine

## 2020-08-31 ENCOUNTER — Other Ambulatory Visit: Payer: Self-pay

## 2020-08-31 ENCOUNTER — Ambulatory Visit
Admission: RE | Admit: 2020-08-31 | Discharge: 2020-08-31 | Disposition: A | Discharge status: Home / Self Care | Payer: BLUE CROSS/BLUE SHIELD | Source: Ambulatory Visit | Attending: Obstetrics & Gynecology | Admitting: Obstetrics & Gynecology

## 2020-08-31 DIAGNOSIS — Z1231 Encounter for screening mammogram for malignant neoplasm of breast: Secondary | ICD-10-CM | POA: Diagnosis present

## 2020-09-11 ENCOUNTER — Encounter: Payer: Self-pay | Admitting: Internal Medicine

## 2020-10-12 ENCOUNTER — Encounter: Payer: Self-pay | Admitting: Internal Medicine

## 2020-10-12 ENCOUNTER — Ambulatory Visit (INDEPENDENT_AMBULATORY_CARE_PROVIDER_SITE_OTHER): Payer: No Typology Code available for payment source | Admitting: Internal Medicine

## 2020-10-12 ENCOUNTER — Other Ambulatory Visit: Payer: Self-pay

## 2020-10-12 VITALS — BP 136/78 | HR 108 | Temp 98.4°F | Ht 62.0 in | Wt 162.0 lb

## 2020-10-12 DIAGNOSIS — R7989 Other specified abnormal findings of blood chemistry: Secondary | ICD-10-CM | POA: Diagnosis not present

## 2020-10-12 DIAGNOSIS — E041 Nontoxic single thyroid nodule: Secondary | ICD-10-CM

## 2020-10-12 DIAGNOSIS — J410 Simple chronic bronchitis: Secondary | ICD-10-CM | POA: Diagnosis not present

## 2020-10-12 NOTE — Progress Notes (Signed)
Date:  10/12/2020   Name:  Nancy Vega   DOB:  June 18, 1976   MRN:  [id number]   Chief Complaint: Hypothyroidism (Follow up. Needs full Thyroid Panel) and Liver function (Recheck liver function)  Thyroid Problem Presents for follow-up (thyroid nodule) visit. Symptoms include tremors and weight gain. Patient reports no constipation, depressed mood, fatigue, hair loss, heat intolerance, leg swelling or palpitations.   Elevated LFTs - last visit may have been due to recent alcohol intake.  She has abstained for the past week.  She denies jaundice, abdominal pain or dark urine.  Chronic bronchitis - seen recently by Pulmonary.  Started on three medications to address the allergic component.  Xyzal, singulair and Astelin.  Lab Results  Component Value Date   CREATININE 0.94 06/25/2020   BUN 14 06/25/2020   NA 138 06/25/2020   K 4.3 06/25/2020   CL 101 06/25/2020   CO2 22 06/25/2020   Lab Results  Component Value Date   CHOL 253 (H) 06/25/2020   HDL 70 06/25/2020   LDLCALC 161 (H) 06/25/2020   TRIG 125 06/25/2020   CHOLHDL 3.6 06/25/2020   Lab Results  Component Value Date   TSH 2.190 06/25/2020   No results found for: HGBA1C Lab Results  Component Value Date   WBC 9.4 06/25/2020   HGB 13.8 06/25/2020   HCT 40.7 06/25/2020   MCV 94 06/25/2020   PLT 309 06/25/2020   Lab Results  Component Value Date   ALT 77 (H) 06/25/2020   AST 38 06/25/2020   ALKPHOS 129 (H) 06/25/2020   BILITOT 0.7 06/25/2020     Review of Systems  Constitutional: Positive for weight gain. Negative for chills, fatigue and fever.  HENT: Positive for voice change.   Eyes: Negative for visual disturbance.  Respiratory: Positive for cough. Negative for chest tightness, shortness of breath and wheezing.   Cardiovascular: Negative for chest pain, palpitations and leg swelling.  Gastrointestinal: Negative for abdominal pain, blood in stool and constipation.  Endocrine: Negative for heat  intolerance.  Genitourinary: Negative for decreased urine volume, difficulty urinating and dysuria.  Neurological: Positive for tremors. Negative for dizziness and headaches.    Patient Active Problem List   Diagnosis Date Noted  . S/P ACL reconstruction 02/24/2020  . Old complete ACL tear, right 10/07/2019  . 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 Chronic pain of both knees 09/23/2019  . Current moderate episode of major depressive disorder without prior episode ([place]) 08/12/2019  . Rupture of anterior cruciate ligament of right knee 08/12/2019  . Left thyroid nodule 06/17/2019  . Chronic bronchitis, simple ([place]) 07/18/2018  . Fibrocystic breast changes, bilateral 03/21/2016  . Anxiety disorder 07/13/2015  . Asthma in adult, mild intermittent, uncomplicated 84/69/6295  . Essential tremor 07/13/2015  . Irritable bowel syndrome with constipation 07/13/2015    Allergies  Allergen Reactions  . Codeine Nausea And Vomiting  . Doxycycline Nausea And Vomiting  . Oxycodone Hives  . Amoxicillin Hives  . Cefdinir Other (See Comments)  . Hydrocodone Other (See Comments)    syncope   . Iodine Solution [Povidone Iodine]     Allergy to cleaning Iodine solution   . Nsaids Other (See Comments)    Tremor intensity  . Tamiflu [Oseltamivir] Hives  . Zithromax [Azithromycin] Hives and Other (See Comments)  . Buspirone Other (See Comments)    Dizziness and fatigue    Past Surgical History:  Procedure Laterality Date  . APPENDECTOMY  06/2015  .  BREAST BIOPSY Left 04/2016   fibroadenoma/ us/bx  . BREAST BIOPSY Right 2012   neg  . BREAST EXCISIONAL BIOPSY Right 2016   neg  . KNEE SURGERY Right 2011  . TONSILLECTOMY Bilateral     Social History   Tobacco Use  . Smoking status: Never Smoker  . Smokeless tobacco: Never Used  Substance Use Topics  . Alcohol use: Yes    Alcohol/week: 2.0 standard drinks    Types: 2 Standard drinks or equivalent per week    Comment: occassion  .  Drug use: No     Medication list has been reviewed and updated.  Current Meds  Medication Sig  . albuterol (VENTOLIN HFA) 108 (90 Base) MCG/ACT inhaler Inhale into the lungs.  Marland Kitchen azelastine (ASTELIN) 0.1 % nasal spray Place into both nostrils.  Marland Kitchen buPROPion (WELLBUTRIN XL) 150 MG 24 hr tablet Take by mouth.  . fluticasone (FLONASE) 50 MCG/ACT nasal spray Place 2 sprays into both nostrils daily.  . fluticasone furoate-vilanterol (BREO ELLIPTA) 100-25 MCG/INH AEPB Inhale into the lungs.  Marland Kitchen levocetirizine (XYZAL) 5 MG tablet Take 5 mg by mouth daily.  Marland Kitchen levonorgestrel (MIRENA) 20 MCG/24HR IUD 1 each by Intrauterine route once.  . montelukast (SINGULAIR) 10 MG tablet Take 10 mg by mouth daily.  . primidone (MYSOLINE) 50 MG tablet Take 100 mg by mouth 2 (two) times daily.   Marland Kitchen tretinoin (RETIN-A) 0.05 % cream Apply topically.    PHQ 2/9 Scores 10/12/2020 06/25/2020 08/12/2019 06/25/2019  PHQ - 2 Score 4 2 3 4   PHQ- 9 Score 6 8 11 22     GAD 7 : Generalized Anxiety Score 10/12/2020 06/25/2020 08/12/2019 03/18/2019  Nervous, Anxious, on Edge 1 1 3 1   Control/stop worrying 1 3 3 3   Worry too much - different things 1 3 3 1   Trouble relaxing 0 2 0 0  Restless 0 0 0 0  Easily annoyed or irritable 0 0 1 0  Afraid - awful might happen 0 0 0 0  Total GAD 7 Score 3 9 10 5   Anxiety Difficulty Not difficult at all Not difficult at all - -    BP Readings from Last 3 Encounters:  10/12/20 136/78  06/25/20 112/82  08/12/19 124/78    Physical Exam Vitals and nursing note reviewed.  Constitutional:      General: She is not in acute distress.    Appearance: She is well-developed.  HENT:     Head: Normocephalic and atraumatic.  Neck:     Thyroid: No thyroid mass, thyromegaly or thyroid tenderness.     Vascular: No carotid bruit.  Cardiovascular:     Rate and Rhythm: Normal rate and regular rhythm.  Pulmonary:     Effort: Pulmonary effort is normal. No respiratory distress.     Breath sounds: No  wheezing or rhonchi.  Abdominal:     General: Abdomen is flat. There is no distension.     Palpations: Abdomen is soft.     Tenderness: There is no abdominal tenderness. There is no guarding or rebound.  Musculoskeletal:        General: Normal range of motion.     Cervical back: Normal range of motion.     Right lower leg: No edema.     Left lower leg: No edema.  Lymphadenopathy:     Cervical: No cervical adenopathy.  Skin:    General: Skin is warm and dry.     Findings: No rash.  Neurological:  Mental Status: She is alert and oriented to person, place, and time.  Psychiatric:        Mood and Affect: Mood normal.     Wt Readings from Last 3 Encounters:  10/12/20 162 lb (73.5 kg)  06/25/20 165 lb (74.8 kg)  08/12/19 149 lb (67.6 kg)    BP 136/78   Pulse (!) 108   Temp 98.4 F (36.9 C) (Oral)   Ht 5\' 2"  (1.575 m)   Wt 162 lb (73.5 kg)   SpO2 98%   BMI 29.63 kg/m   Assessment and Plan: 1. Left thyroid nodule Continue follow up with thyroid panel q 6 mo US done 06/2019 - one nodule almost completely cystic - no routine follow up is needed - Thyroid Panel With TSH  2. Elevated liver function tests Repeat today - Comprehensive metabolic panel  3. Chronic bronchitis, simple ([place]) Being followed by Pulmonary   Partially dictated using Editor, commissioning. Any errors are unintentional.  Halina Maidens, MD [facility]  10/12/2020

## 2020-10-13 ENCOUNTER — Encounter: Payer: Self-pay | Admitting: Internal Medicine

## 2020-10-13 ENCOUNTER — Other Ambulatory Visit: Payer: Self-pay

## 2020-10-13 DIAGNOSIS — R7989 Other specified abnormal findings of blood chemistry: Secondary | ICD-10-CM

## 2020-10-13 LAB — COMPREHENSIVE METABOLIC PANEL
ALT: 36 IU/L — ABNORMAL HIGH (ref 0–32)
AST: 26 IU/L (ref 0–40)
Albumin/Globulin Ratio: 1.9 (ref 1.2–2.2)
Albumin: 4.7 g/dL (ref 3.8–4.8)
Alkaline Phosphatase: 131 IU/L — ABNORMAL HIGH (ref 44–121)
BUN/Creatinine Ratio: 12 (ref 9–23)
BUN: 10 mg/dL (ref 6–24)
Bilirubin Total: 0.6 mg/dL (ref 0.0–1.2)
CO2: 24 mmol/L (ref 20–29)
Calcium: 9.8 mg/dL (ref 8.7–10.2)
Chloride: 101 mmol/L (ref 96–106)
Creatinine, Ser: 0.85 mg/dL (ref 0.57–1.00)
GFR calc Af Amer: 97 mL/min/{1.73_m2} (ref >59)
GFR calc non Af Amer: 84 mL/min/{1.73_m2} (ref >59)
Globulin, Total: 2.5 g/dL (ref 1.5–4.5)
Glucose: 93 mg/dL (ref 65–99)
Potassium: 4.1 mmol/L (ref 3.5–5.2)
Sodium: 139 mmol/L (ref 134–144)
Total Protein: 7.2 g/dL (ref 6.0–8.5)

## 2020-10-13 LAB — THYROID PANEL WITH TSH
Free Thyroxine Index: 1.7 (ref 1.2–4.9)
T3 Uptake Ratio: 25 % (ref 24–39)
T4, Total: 6.6 ug/dL (ref 4.5–12.0)
TSH: 2.31 u[IU]/mL (ref 0.450–4.500)

## 2020-10-13 NOTE — Progress Notes (Signed)
error 

## 2020-10-13 NOTE — Telephone Encounter (Signed)
Please Advise.  KP

## 2020-10-16 ENCOUNTER — Ambulatory Visit: Payer: BLUE CROSS/BLUE SHIELD

## 2020-10-19 ENCOUNTER — Other Ambulatory Visit: Payer: Self-pay

## 2020-10-19 ENCOUNTER — Ambulatory Visit
Admission: RE | Admit: 2020-10-19 | Discharge: 2020-10-19 | Disposition: A | Discharge status: Home / Self Care | Payer: BLUE CROSS/BLUE SHIELD | Source: Ambulatory Visit | Attending: Internal Medicine | Admitting: Internal Medicine

## 2020-10-19 DIAGNOSIS — R7989 Other specified abnormal findings of blood chemistry: Secondary | ICD-10-CM | POA: Diagnosis present

## 2020-10-31 ENCOUNTER — Encounter: Payer: Self-pay | Admitting: Internal Medicine

## 2020-11-23 ENCOUNTER — Other Ambulatory Visit: Payer: Self-pay | Admitting: Internal Medicine

## 2020-11-23 NOTE — Telephone Encounter (Signed)
Requested medication (s) are due for refill today -yes  Requested medication (s) are on the active medication list -yes  Future visit scheduled -yes  Last refill: 06/18/19, 5 months ago by historical provider  Notes to clinic: Request RF of medication prescribed by historical provider  Requested Prescriptions  Pending Prescriptions Disp Refills   buPROPion (WELLBUTRIN XL) 150 MG 24 hr tablet [Pharmacy Med Name: BUPROPION HCL ER (XL) 150 MG TAB] 90 tablet     Sig: TAKE (1) TABLET BY MOUTH EVERY DAY      Psychiatry: Antidepressants - bupropion Passed - 11/23/2020  9:47 AM      Passed - Completed PHQ-2 or PHQ-9 in the last 360 days      Passed - Last BP in normal range    BP Readings from Last 1 Encounters:  10/12/20 136/78          Passed - Valid encounter within last 6 months    Recent Outpatient Visits           1 month ago Left thyroid nodule   [facility] Glean Hess, MD   5 months ago Annual physical exam   [facility] Glean Hess, MD   1 year ago Current moderate episode of major depressive disorder without prior episode [facility])   [facility] Glean Hess, MD   1 year ago Annual physical exam   [facility] Glean Hess, MD   1 year ago Vertigo   [facility] Glean Hess, MD       Future Appointments             In 7 months Glean Hess, MD [facility], [facility]                Requested Prescriptions  Pending Prescriptions Disp Refills   buPROPion (WELLBUTRIN XL) 150 MG 24 hr tablet [Pharmacy Med Name: BUPROPION HCL ER (XL) 150 MG TAB] 90 tablet     Sig: TAKE (1) TABLET BY MOUTH EVERY DAY      Psychiatry: Antidepressants - bupropion Passed - 11/23/2020  9:47 AM      Passed - Completed PHQ-2 or PHQ-9 in the last 360 days      Passed - Last BP in normal range    BP Readings from Last 1 Encounters:  10/12/20 136/78          Passed - Valid encounter within last 6  months    Recent Outpatient Visits           1 month ago Left thyroid nodule   [facility] Glean Hess, MD   5 months ago Annual physical exam   [facility] Glean Hess, MD   1 year ago Current moderate episode of major depressive disorder without prior episode [facility])   [facility] Glean Hess, MD   1 year ago Annual physical exam   [facility] Glean Hess, MD   1 year ago Vertigo   [facility] Glean Hess, MD       Future Appointments             In 7 months Army Melia Jesse Sans, MD [facility], [facility]

## 2020-12-30 ENCOUNTER — Encounter: Payer: Self-pay | Admitting: Internal Medicine

## 2020-12-30 ENCOUNTER — Ambulatory Visit (INDEPENDENT_AMBULATORY_CARE_PROVIDER_SITE_OTHER): Payer: No Typology Code available for payment source | Admitting: Internal Medicine

## 2020-12-30 ENCOUNTER — Other Ambulatory Visit: Payer: Self-pay

## 2020-12-30 VITALS — BP 122/86 | HR 131 | Temp 99.4°F | Ht 62.0 in | Wt 159.0 lb

## 2020-12-30 DIAGNOSIS — J029 Acute pharyngitis, unspecified: Secondary | ICD-10-CM | POA: Diagnosis not present

## 2020-12-30 DIAGNOSIS — J410 Simple chronic bronchitis: Secondary | ICD-10-CM | POA: Diagnosis not present

## 2020-12-30 MED ORDER — LEVOFLOXACIN 500 MG PO TABS: 500.0000 mg | ORAL_TABLET | Freq: Every day | ORAL | 0 refills | Status: AC

## 2020-12-30 MED ORDER — PROMETHAZINE-DM 6.25-15 MG/5ML PO SYRP: 5.0000 mL | ORAL_SOLUTION | Freq: Four times a day (QID) | ORAL | 0 refills | Status: DC | PRN

## 2020-12-30 MED ORDER — PREDNISONE 10 MG PO TABS: 10.0000 mg | ORAL_TABLET | ORAL | 0 refills | Status: AC

## 2020-12-30 NOTE — Telephone Encounter (Signed)
Please call pt to schedule an appt.  KP

## 2020-12-30 NOTE — Progress Notes (Signed)
Date:  12/30/2020   Name:  Nancy Vega   DOB:  1976/05/10   MRN:  [id number]   Chief Complaint: Cough (X1 day, And sore throat, cough (green mucous) , hurts to swallow, SOB, fatigue )  Cough This is a new problem. The current episode started yesterday. The problem occurs every few minutes. The cough is productive of sputum. Associated symptoms include a fever, a sore throat, shortness of breath and wheezing. Pertinent negatives include no chest pain, chills, ear pain or headaches. She has tried a beta-agonist inhaler and steroid inhaler for the symptoms. The treatment provided mild relief. Her past medical history is significant for bronchitis.  Sore Throat  This is a new problem. The current episode started yesterday. Neither side of throat is experiencing more pain than the other. There has been no fever. Associated symptoms include coughing, shortness of breath, swollen glands and trouble swallowing. Pertinent negatives include no congestion, ear pain or headaches. She has had no exposure to strep or mono. She has tried cool liquids for the symptoms.    Lab Results  Component Value Date   CREATININE 0.85 10/12/2020   BUN 10 10/12/2020   NA 139 10/12/2020   K 4.1 10/12/2020   CL 101 10/12/2020   CO2 24 10/12/2020   Lab Results  Component Value Date   CHOL 253 (H) 06/25/2020   HDL 70 06/25/2020   LDLCALC 161 (H) 06/25/2020   TRIG 125 06/25/2020   CHOLHDL 3.6 06/25/2020   Lab Results  Component Value Date   TSH 2.310 10/12/2020   No results found for: HGBA1C Lab Results  Component Value Date   WBC 9.4 06/25/2020   HGB 13.8 06/25/2020   HCT 40.7 06/25/2020   MCV 94 06/25/2020   PLT 309 06/25/2020   Lab Results  Component Value Date   ALT 36 (H) 10/12/2020   AST 26 10/12/2020   ALKPHOS 131 (H) 10/12/2020   BILITOT 0.6 10/12/2020     Review of Systems  Constitutional: Positive for fever. Negative for chills.  HENT: Positive for sore throat and trouble  swallowing. Negative for congestion, ear pain and sinus pressure.   Respiratory: Positive for cough, shortness of breath and wheezing.   Cardiovascular: Negative for chest pain.  Neurological: Negative for dizziness and headaches.    Patient Active Problem List   Diagnosis Date Noted  . S/P ACL reconstruction 02/24/2020  . Old complete ACL tear, right 10/07/2019  . 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 Chronic pain of both knees 09/23/2019  . Current moderate episode of major depressive disorder without prior episode ([place]) 08/12/2019  . Rupture of anterior cruciate ligament of right knee 08/12/2019  . Left thyroid nodule 06/17/2019  . Chronic bronchitis, simple ([place]) 07/18/2018  . Fibrocystic breast changes, bilateral 03/21/2016  . Anxiety disorder 07/13/2015  . Asthma in adult, mild intermittent, uncomplicated 24/58/0998  . Essential tremor 07/13/2015  . Irritable bowel syndrome with constipation 07/13/2015    Allergies  Allergen Reactions  . Codeine Nausea And Vomiting  . Doxycycline Nausea And Vomiting  . Oxycodone Hives  . Amoxicillin Hives  . Cefdinir Other (See Comments)  . Hydrocodone Other (See Comments)    syncope   . Iodine Solution [Povidone Iodine]     Allergy to cleaning Iodine solution   . Nsaids Other (See Comments)    Tremor intensity  . Tamiflu [Oseltamivir] Hives  . Zithromax [Azithromycin] Hives and Other (See Comments)  . Buspirone Other (See  Comments)    Dizziness and fatigue    Past Surgical History:  Procedure Laterality Date  . APPENDECTOMY  06/2015  . BREAST BIOPSY Left 04/2016   fibroadenoma/ us/bx  . BREAST BIOPSY Right 2012   neg  . BREAST EXCISIONAL BIOPSY Right 2016   neg  . KNEE SURGERY Right 2011  . TONSILLECTOMY Bilateral     Social History   Tobacco Use  . Smoking status: Never Smoker  . Smokeless tobacco: Never Used  Substance Use Topics  . Alcohol use: Yes    Alcohol/week: 2.0 standard drinks    Types: 2 Standard  drinks or equivalent per week    Comment: occassion  . Drug use: No     Medication list has been reviewed and updated.  Current Meds  Medication Sig  . albuterol (VENTOLIN HFA) 108 (90 Base) MCG/ACT inhaler Inhale into the lungs.  Marland Kitchen azelastine (ASTELIN) 0.1 % nasal spray Place into both nostrils.  Marland Kitchen buPROPion (WELLBUTRIN XL) 150 MG 24 hr tablet TAKE (1) TABLET BY MOUTH EVERY DAY  . fluticasone furoate-vilanterol (BREO ELLIPTA) 100-25 MCG/INH AEPB Inhale into the lungs.  Marland Kitchen levocetirizine (XYZAL) 5 MG tablet Take 5 mg by mouth daily.  Marland Kitchen levonorgestrel (MIRENA) 20 MCG/24HR IUD 1 each by Intrauterine route once.  . montelukast (SINGULAIR) 10 MG tablet Take 10 mg by mouth daily.  . primidone (MYSOLINE) 50 MG tablet Take 100 mg by mouth 2 (two) times daily.   Marland Kitchen tretinoin (RETIN-A) 0.05 % cream Apply topically.    PHQ 2/9 Scores 12/30/2020 10/12/2020 06/25/2020 08/12/2019  PHQ - 2 Score 2 4 2 3   PHQ- 9 Score 7 6 8 11     GAD 7 : Generalized Anxiety Score 12/30/2020 10/12/2020 06/25/2020 08/12/2019  Nervous, Anxious, on Edge 3 1 1 3   Control/stop worrying 3 1 3 3   Worry too much - different things 3 1 3 3   Trouble relaxing 0 0 2 0  Restless 0 0 0 0  Easily annoyed or irritable 0 0 0 1  Afraid - awful might happen 0 0 0 0  Total GAD 7 Score 9 3 9 10   Anxiety Difficulty - Not difficult at all Not difficult at all -    BP Readings from Last 3 Encounters:  12/30/20 122/86  10/12/20 136/78  06/25/20 112/82    Physical Exam Vitals and nursing note reviewed.  Constitutional:      General: She is not in acute distress.    Appearance: She is well-developed. She is not ill-appearing.  HENT:     Head: Normocephalic and atraumatic.     Right Ear: Tympanic membrane and ear canal normal.     Left Ear: Tympanic membrane and ear canal normal.     Nose:     Right Sinus: Maxillary sinus tenderness present.     Left Sinus: Maxillary sinus tenderness present.     Mouth/Throat:     Pharynx:  Posterior oropharyngeal erythema present. No oropharyngeal exudate.  Cardiovascular:     Rate and Rhythm: Normal rate and regular rhythm.  Pulmonary:     Effort: Pulmonary effort is normal. No respiratory distress.     Breath sounds: No wheezing or rhonchi.     Comments: Frequent loose cough Musculoskeletal:        General: Normal range of motion.     Cervical back: Normal range of motion.  Lymphadenopathy:     Cervical: Cervical adenopathy present.  Skin:    General: Skin is warm and dry.  Findings: No rash.  Neurological:     Mental Status: She is alert and oriented to person, place, and time.  Psychiatric:        Mood and Affect: Mood and affect normal.        Behavior: Behavior normal.        Thought Content: Thought content normal.     Wt Readings from Last 3 Encounters:  12/30/20 159 lb (72.1 kg)  10/12/20 162 lb (73.5 kg)  06/25/20 165 lb (74.8 kg)    BP 122/86   Pulse (!) 131   Temp 99.4 F (37.4 C) (Oral)   Ht 5\' 2"  (1.575 m)   Wt 159 lb (72.1 kg)   SpO2 97%   BMI 29.08 kg/m   Assessment and Plan: 1. Chronic bronchitis, simple ([facility]) Flare following viral URI - doubt covid - levofloxacin (LEVAQUIN) 500 MG tablet; Take 1 tablet (500 mg total) by mouth daily for 7 days.  Dispense: 7 tablet; Refill: 0 - predniSONE (DELTASONE) 10 MG tablet; Take 1 tablet (10 mg total) by mouth as directed for 6 days. Take 6,5,4,3,2,1 then stop  Dispense: 21 tablet; Refill: 0 - promethazine-dextromethorphan (PROMETHAZINE-DM) 6.25-15 MG/5ML syrup; Take 5 mLs by mouth 4 (four) times daily as needed for cough.  Dispense: 118 mL; Refill: 0  2. Pharyngitis, unspecified etiology Tylenol as needed; warm or cool liquids   Partially dictated using Editor, commissioning. Any errors are unintentional.  Halina Maidens, MD [facility]  12/30/2020

## 2021-01-12 ENCOUNTER — Other Ambulatory Visit: Payer: Self-pay

## 2021-01-12 ENCOUNTER — Encounter: Payer: Self-pay | Admitting: Internal Medicine

## 2021-01-12 MED ORDER — BUPROPION HCL ER (XL) 150 MG PO TB24: 150.0000 mg | ORAL_TABLET | Freq: Two times a day (BID) | ORAL | 1 refills | Status: DC

## 2021-02-24 ENCOUNTER — Encounter: Payer: Self-pay | Admitting: Internal Medicine

## 2021-02-27 ENCOUNTER — Other Ambulatory Visit: Payer: Self-pay | Admitting: Internal Medicine

## 2021-03-01 ENCOUNTER — Telehealth: Payer: Self-pay

## 2021-03-01 ENCOUNTER — Other Ambulatory Visit: Payer: Self-pay

## 2021-03-01 NOTE — Telephone Encounter (Signed)
Called [name] the genetic counselor to see what orders need to placed for pt and also how to do it. [name] is going to see what she can find out and call me back.  KP

## 2021-03-01 NOTE — Telephone Encounter (Signed)
Pt response.  KP

## 2021-03-30 ENCOUNTER — Telehealth: Payer: Self-pay

## 2021-03-30 NOTE — Telephone Encounter (Signed)
Nancy Vega (Dietitian at [facility]) called she stated that the pt needed to be build thru [facility] for insurance to be able to pay for testing. She stated that [facility] does not do the testing that needed. [name] asked if there was another testing site that we would possibly use I told her [facility] is another lab we use. She is going to contact [facility] to see if the can do the labs that's needed and will give [place] a call back. She stated she is going to reach out to the pt and let her know what's going on.  KP

## 2021-05-06 ENCOUNTER — Encounter: Payer: Self-pay | Admitting: Internal Medicine

## 2021-06-30 ENCOUNTER — Encounter: Payer: Self-pay | Admitting: Internal Medicine

## 2021-07-01 ENCOUNTER — Encounter: Payer: Self-pay | Admitting: Internal Medicine

## 2021-07-01 ENCOUNTER — Ambulatory Visit (INDEPENDENT_AMBULATORY_CARE_PROVIDER_SITE_OTHER): Payer: No Typology Code available for payment source | Admitting: Internal Medicine

## 2021-07-01 ENCOUNTER — Other Ambulatory Visit: Payer: Self-pay

## 2021-07-01 VITALS — BP 132/70 | HR 97 | Ht 62.0 in | Wt 163.0 lb

## 2021-07-01 DIAGNOSIS — F321 Major depressive disorder, single episode, moderate: Secondary | ICD-10-CM

## 2021-07-01 DIAGNOSIS — G250 Essential tremor: Secondary | ICD-10-CM

## 2021-07-01 DIAGNOSIS — Z1231 Encounter for screening mammogram for malignant neoplasm of breast: Secondary | ICD-10-CM | POA: Diagnosis not present

## 2021-07-01 DIAGNOSIS — Z0000 Encounter for general adult medical examination without abnormal findings: Secondary | ICD-10-CM | POA: Diagnosis not present

## 2021-07-01 DIAGNOSIS — J4520 Mild intermittent asthma, uncomplicated: Secondary | ICD-10-CM | POA: Diagnosis not present

## 2021-07-01 NOTE — Progress Notes (Signed)
Date:  07/01/2021   Name:  Nancy Vega   DOB:  1976-12-04   MRN:  [id number]   Chief Complaint: Annual Exam Gypsy Kellogg is a 45 y.o. female who presents today for her Complete Annual Exam. She feels well. She reports exercising . She reports she is sleeping well. Breast complaints are none.  She gets Pap and Mammo with GYN.  Mammogram: 08/2020 @ [facility] but ordered by GYN in [place]: none Pap smear: 07/2019 Colonoscopy: none  Immunization History  Administered Date(s) Administered   HPV 9-valent 09/09/2019, 11/18/2019, 02/21/2020   PFIZER(Purple Top)SARS-COV-2 Vaccination 03/07/2020, 03/28/2020, 10/31/2020   Tdap 12/19/2012, 10/02/2013  She does not get any flu vaccines.  Asthma There is no cough, shortness of breath or wheezing. Pertinent negatives include no chest pain, fever, headaches or trouble swallowing. Her past medical history is significant for asthma.  Depression        Associated symptoms include no fatigue and no headaches. Tremor - controlled.  Lab Results  Component Value Date   CREATININE 0.85 10/12/2020   BUN 10 10/12/2020   NA 139 10/12/2020   K 4.1 10/12/2020   CL 101 10/12/2020   CO2 24 10/12/2020   Lab Results  Component Value Date   CHOL 253 (H) 06/25/2020   HDL 70 06/25/2020   LDLCALC 161 (H) 06/25/2020   TRIG 125 06/25/2020   CHOLHDL 3.6 06/25/2020   Lab Results  Component Value Date   TSH 2.310 10/12/2020   No results found for: HGBA1C Lab Results  Component Value Date   WBC 9.4 06/25/2020   HGB 13.8 06/25/2020   HCT 40.7 06/25/2020   MCV 94 06/25/2020   PLT 309 06/25/2020   Lab Results  Component Value Date   ALT 36 (H) 10/12/2020   AST 26 10/12/2020   ALKPHOS 131 (H) 10/12/2020   BILITOT 0.6 10/12/2020     Review of Systems  Constitutional:  Negative for chills, fatigue and fever.  HENT:  Negative for congestion, hearing loss, tinnitus, trouble swallowing and voice change.   Eyes:  Negative for visual  disturbance.  Respiratory:  Negative for cough, chest tightness, shortness of breath and wheezing.   Cardiovascular:  Negative for chest pain, palpitations and leg swelling.  Gastrointestinal:  Negative for abdominal pain, constipation, diarrhea and vomiting.  Endocrine: Negative for polydipsia and polyuria.  Genitourinary:  Negative for dysuria, frequency, genital sores, vaginal bleeding and vaginal discharge.  Musculoskeletal:  Negative for arthralgias, gait problem and joint swelling.  Skin:  Negative for color change and rash.       Itching all over - no rash  Neurological:  Negative for dizziness, tremors, light-headedness and headaches.  Hematological:  Negative for adenopathy. Does not bruise/bleed easily.  Psychiatric/Behavioral:  Positive for depression. Negative for dysphoric mood and sleep disturbance. The patient is not nervous/anxious.    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, right 10/07/2019   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   Chronic pain of both knees 09/23/2019   Current moderate episode of major depressive disorder without prior episode ([place]) 08/12/2019   Rupture of anterior cruciate ligament of right knee 08/12/2019   Left thyroid nodule 06/17/2019   Chronic bronchitis, simple ([place]) 07/18/2018   Fibrocystic breast changes, bilateral 03/21/2016   Anxiety disorder 07/13/2015   Asthma in adult, mild intermittent, uncomplicated 25/36/6440   Essential tremor 07/13/2015   Irritable  bowel syndrome with constipation 07/13/2015    Allergies  Allergen Reactions   Codeine Nausea And Vomiting   Doxycycline Nausea And Vomiting   Oxycodone Hives   Amoxicillin Hives   Cefdinir Other (See Comments)   Hydrocodone Other (See Comments)    syncope    Iodine Solution [Povidone Iodine]     Allergy to cleaning Iodine solution    Nsaids Other (See Comments)    Tremor intensity   Tamiflu  [Oseltamivir] Hives   Zithromax [Azithromycin] Hives and Other (See Comments)   Buspirone Other (See Comments)    Dizziness and fatigue    Past Surgical History:  Procedure Laterality Date   APPENDECTOMY  06/2015   BREAST BIOPSY Left 04/2016   fibroadenoma/ us/bx   BREAST BIOPSY Right 2012   neg   BREAST EXCISIONAL BIOPSY Right 2016   neg   KNEE SURGERY Right 2011   TONSILLECTOMY Bilateral     Social History   Tobacco Use   Smoking status: Never   Smokeless tobacco: Never  Substance Use Topics   Alcohol use: Yes    Alcohol/week: 2.0 standard drinks    Types: 2 Standard drinks or equivalent per week    Comment: occassion   Drug use: No     Medication list has been reviewed and updated.  Current Meds  Medication Sig   albuterol (VENTOLIN HFA) 108 (90 Base) MCG/ACT inhaler Inhale into the lungs.   azelastine (ASTELIN) 0.1 % nasal spray Place into both nostrils.   budesonide (PULMICORT) 0.5 MG/2ML nebulizer solution Take by nebulization.   buPROPion (WELLBUTRIN XL) 150 MG 24 hr tablet Take 1 tablet (150 mg total) by mouth 2 (two) times daily.   fluticasone (FLONASE) 50 MCG/ACT nasal spray Place 2 sprays into both nostrils daily.   fluticasone furoate-vilanterol (BREO ELLIPTA) 100-25 MCG/INH AEPB Inhale into the lungs.   levocetirizine (XYZAL) 5 MG tablet Take 5 mg by mouth daily.   Levonorgestrel (MIRENA, 52 MG, IU) Mirena 20 mcg/24 hours (7 yrs) 52 mg intrauterine device  Take by intrauterine route.   montelukast (SINGULAIR) 10 MG tablet Take 10 mg by mouth daily.   primidone (MYSOLINE) 50 MG tablet Take 100 mg by mouth 2 (two) times daily.     PHQ 2/9 Scores 07/01/2021 12/30/2020 10/12/2020 06/25/2020  PHQ - 2 Score 2 2 4 2   PHQ- 9 Score 7 7 6 8     GAD 7 : Generalized Anxiety Score 07/01/2021 12/30/2020 10/12/2020 06/25/2020  Nervous, Anxious, on Edge 1 3 1 1   Control/stop worrying 1 3 1 3   Worry too much - different things 1 3 1 3   Trouble relaxing 1 0 0 2  Restless 0  0 0 0  Easily annoyed or irritable 0 0 0 0  Afraid - awful might happen 0 0 0 0  Total GAD 7 Score 4 9 3 9   Anxiety Difficulty Not difficult at all - Not difficult at all Not difficult at all    BP Readings from Last 3 Encounters:  07/01/21 132/70  12/30/20 122/86  10/12/20 136/78    Physical Exam Vitals and nursing note reviewed.  Constitutional:      General: She is not in acute distress.    Appearance: She is well-developed.  HENT:     Head: Normocephalic and atraumatic.     Right Ear: Tympanic membrane and ear canal normal.     Left Ear: Tympanic membrane and ear canal normal.     Nose:  Right Sinus: No maxillary sinus tenderness.     Left Sinus: No maxillary sinus tenderness.  Eyes:     General: No scleral icterus.       Right eye: No discharge.        Left eye: No discharge.     Conjunctiva/sclera: Conjunctivae normal.  Neck:     Thyroid: No thyromegaly.     Vascular: No carotid bruit.  Cardiovascular:     Rate and Rhythm: Normal rate and regular rhythm.     Pulses: Normal pulses.     Heart sounds: Normal heart sounds.  Pulmonary:     Effort: Pulmonary effort is normal. No respiratory distress.     Breath sounds: No wheezing.  Abdominal:     General: Bowel sounds are normal.     Palpations: Abdomen is soft.     Tenderness: There is no abdominal tenderness.  Musculoskeletal:     Cervical back: Normal range of motion. No erythema.     Right lower leg: No edema.     Left lower leg: No edema.  Lymphadenopathy:     Cervical: No cervical adenopathy.  Skin:    General: Skin is warm and dry.     Findings: No erythema, lesion or rash.  Neurological:     Mental Status: She is alert and oriented to person, place, and time.     Cranial Nerves: No cranial nerve deficit.     Sensory: No sensory deficit.     Motor: No tremor.     Coordination: Coordination is intact.     Gait: Gait is intact.     Deep Tendon Reflexes: Reflexes are normal and symmetric.   Psychiatric:        Attention and Perception: Attention normal.        Mood and Affect: Mood normal.    Wt Readings from Last 3 Encounters:  07/01/21 163 lb (73.9 kg)  12/30/20 159 lb (72.1 kg)  10/12/20 162 lb (73.5 kg)    BP 132/70 (BP Location: Right Arm, Patient Position: Sitting, Cuff Size: Normal)   Pulse 97   Ht 5\' 2"  (1.575 m)   Wt 163 lb (73.9 kg)   SpO2 99%   BMI 29.81 kg/m   Assessment and Plan: 1. Annual physical exam Normal exam Unclear cause of pruritis - recommend Xyzal bid-tid as needed - CBC with Differential/Platelet - Comprehensive metabolic panel - Lipid panel - TSH + free T4  2. Encounter for screening mammogram for breast cancer To be scheduled at [facility]  3. Asthma in adult, mild intermittent, uncomplicated Continue Pulmonary follow up. She is a candidate for Prevnar-20 which she will consider  4. Current moderate episode of major depressive disorder without prior episode ([place]) Clinically stable on current regimen with good control of symptoms, No SI or HI. Will continue current therapy with Bupropion.  5. Essential tremor Controlled on Primidone   Partially dictated using Editor, commissioning. Any errors are unintentional.  Halina Maidens, MD [facility]  07/01/2021

## 2021-07-01 NOTE — Patient Instructions (Signed)
Can schedule Prevnar-20 with nurse in the future.

## 2021-07-02 ENCOUNTER — Encounter: Payer: Self-pay | Admitting: Internal Medicine

## 2021-07-02 LAB — COMPREHENSIVE METABOLIC PANEL
ALT: 19 IU/L (ref 0–32)
AST: 20 IU/L (ref 0–40)
Albumin/Globulin Ratio: 1.9 (ref 1.2–2.2)
Albumin: 4.7 g/dL (ref 3.8–4.8)
Alkaline Phosphatase: 115 IU/L (ref 44–121)
BUN/Creatinine Ratio: 16 (ref 9–23)
BUN: 15 mg/dL (ref 6–24)
Bilirubin Total: 0.4 mg/dL (ref 0.0–1.2)
CO2: 21 mmol/L (ref 20–29)
Calcium: 9.5 mg/dL (ref 8.7–10.2)
Chloride: 105 mmol/L (ref 96–106)
Creatinine, Ser: 0.91 mg/dL (ref 0.57–1.00)
Globulin, Total: 2.5 g/dL (ref 1.5–4.5)
Glucose: 96 mg/dL (ref 65–99)
Potassium: 4.6 mmol/L (ref 3.5–5.2)
Sodium: 142 mmol/L (ref 134–144)
Total Protein: 7.2 g/dL (ref 6.0–8.5)
eGFR: 80 mL/min/{1.73_m2} (ref >59)

## 2021-07-02 LAB — CBC WITH DIFFERENTIAL/PLATELET
Basophils Absolute: 0 10*3/uL (ref 0.0–0.2)
Basos: 0 %
EOS (ABSOLUTE): 0.1 10*3/uL (ref 0.0–0.4)
Eos: 1 %
Hematocrit: 40.6 % (ref 34.0–46.6)
Hemoglobin: 13.7 g/dL (ref 11.1–15.9)
Immature Grans (Abs): 0 10*3/uL (ref 0.0–0.1)
Immature Granulocytes: 0 %
Lymphocytes Absolute: 2 10*3/uL (ref 0.7–3.1)
Lymphs: 22 %
MCH: 31.3 pg (ref 26.6–33.0)
MCHC: 33.7 g/dL (ref 31.5–35.7)
MCV: 93 fL (ref 79–97)
Monocytes Absolute: 0.5 10*3/uL (ref 0.1–0.9)
Monocytes: 6 %
Neutrophils Absolute: 6.4 10*3/uL (ref 1.4–7.0)
Neutrophils: 71 %
Platelets: 351 10*3/uL (ref 150–450)
RBC: 4.38 x10E6/uL (ref 3.77–5.28)
RDW: 12.6 % (ref 11.7–15.4)
WBC: 9 10*3/uL (ref 3.4–10.8)

## 2021-07-02 LAB — LIPID PANEL
Chol/HDL Ratio: 3.9 ratio (ref 0.0–4.4)
Cholesterol, Total: 229 mg/dL — ABNORMAL HIGH (ref 100–199)
HDL: 58 mg/dL (ref >39)
LDL Chol Calc (NIH): 151 mg/dL — ABNORMAL HIGH (ref 0–99)
Triglycerides: 111 mg/dL (ref 0–149)
VLDL Cholesterol Cal: 20 mg/dL (ref 5–40)

## 2021-07-02 LAB — TSH+FREE T4
Free T4: 1.04 ng/dL (ref 0.82–1.77)
TSH: 1.46 u[IU]/mL (ref 0.450–4.500)

## 2021-07-07 ENCOUNTER — Other Ambulatory Visit: Payer: Self-pay | Admitting: Obstetrics & Gynecology

## 2021-07-07 DIAGNOSIS — Z1231 Encounter for screening mammogram for malignant neoplasm of breast: Secondary | ICD-10-CM

## 2021-07-16 ENCOUNTER — Ambulatory Visit (INDEPENDENT_AMBULATORY_CARE_PROVIDER_SITE_OTHER): Payer: No Typology Code available for payment source

## 2021-07-16 ENCOUNTER — Other Ambulatory Visit: Payer: Self-pay

## 2021-07-16 DIAGNOSIS — Z23 Encounter for immunization: Secondary | ICD-10-CM | POA: Diagnosis not present

## 2021-07-16 NOTE — Progress Notes (Signed)
Date:  07/16/2021   Name:  Nancy Vega   DOB:  1976/08/24   MRN:  [id number]   Chief Complaint: Pneumonia Vaccine- 20    Lab Results  Component Value Date   CREATININE 0.91 07/01/2021   BUN 15 07/01/2021   NA 142 07/01/2021   K 4.6 07/01/2021   CL 105 07/01/2021   CO2 21 07/01/2021   Lab Results  Component Value Date   CHOL 229 (H) 07/01/2021   HDL 58 07/01/2021   LDLCALC 151 (H) 07/01/2021   TRIG 111 07/01/2021   CHOLHDL 3.9 07/01/2021   Lab Results  Component Value Date   TSH 1.460 07/01/2021   No results found for: HGBA1C Lab Results  Component Value Date   WBC 9.0 07/01/2021   HGB 13.7 07/01/2021   HCT 40.6 07/01/2021   MCV 93 07/01/2021   PLT 351 07/01/2021   Lab Results  Component Value Date   ALT 19 07/01/2021   AST 20 07/01/2021   ALKPHOS 115 07/01/2021   BILITOT 0.4 07/01/2021     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, right 10/07/2019   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   Chronic pain of both knees 09/23/2019   Current moderate episode of major depressive disorder without prior episode ([place]) 08/12/2019   Rupture of anterior cruciate ligament of right knee 08/12/2019   Left thyroid nodule 06/17/2019   Chronic bronchitis, simple ([place]) 07/18/2018   Fibrocystic breast changes, bilateral 03/21/2016   Anxiety disorder 07/13/2015   Asthma in adult, mild intermittent, uncomplicated [id number]   Essential tremor 07/13/2015   Irritable bowel syndrome with constipation 07/13/2015    Allergies  Allergen Reactions   Codeine Nausea And Vomiting   Doxycycline Nausea And Vomiting   Oxycodone Hives   Amoxicillin Hives   Cefdinir Other (See Comments)   Hydrocodone Other (See Comments)    syncope    Iodine Solution [Povidone Iodine]     Allergy to cleaning Iodine solution    Nsaids Other (See Comments)    Tremor intensity   Tamiflu [Oseltamivir] Hives    Zithromax [Azithromycin] Hives and Other (See Comments)   Buspirone Other (See Comments)    Dizziness and fatigue    Past Surgical History:  Procedure Laterality Date   APPENDECTOMY  06/2015   BREAST BIOPSY Left 04/2016   fibroadenoma/ us/bx   BREAST BIOPSY Right 2012   neg   BREAST EXCISIONAL BIOPSY Right 2016   neg   KNEE SURGERY Right 2011   TONSILLECTOMY Bilateral     Social History   Tobacco Use   Smoking status: Never   Smokeless tobacco: Never  Substance Use Topics   Alcohol use: Yes    Alcohol/week: 2.0 standard drinks    Types: 2 Standard drinks or equivalent per week    Comment: occassion   Drug use: No     Medication list has been reviewed and updated.  No outpatient medications have been marked as taking for the 07/16/21 encounter (Clinical Support) with Clista Bernhardt, CMA.    PHQ 2/9 Scores 07/01/2021 12/30/2020 10/12/2020 06/25/2020  PHQ - 2 Score '2 2 4 2  '$ PHQ- 9 Score '7 7 6 8    '$ GAD 7 : Generalized Anxiety Score 07/01/2021 12/30/2020 10/12/2020 06/25/2020  Nervous, Anxious, on Edge '1 3 1 1  '$ Control/stop worrying '1 3 1 3  '$ Worry too much - different things  $'1 3 1 3  'w$ Trouble relaxing 1 0 0 2  Restless 0 0 0 0  Easily annoyed or irritable 0 0 0 0  Afraid - awful might happen 0 0 0 0  Total GAD 7 Score '4 9 3 9  '$ Anxiety Difficulty Not difficult at all - Not difficult at all Not difficult at all    BP Readings from Last 3 Encounters:  07/01/21 132/70  12/30/20 122/86  10/12/20 136/78    Wt Readings from Last 3 Encounters:  07/01/21 163 lb (73.9 kg)  12/30/20 159 lb (72.1 kg)  10/12/20 162 lb (73.5 kg)    There were no vitals taken for this visit.  Assessment and Plan:  Patient came into clinic today as walk-in to receive Pnuemonia Vaccine. Patient placed in [place]. Patient tolerated injection well. VIS given to take home and described to patient.  Clista Bernhardt, CMA (AAMA)

## 2021-07-17 ENCOUNTER — Other Ambulatory Visit: Payer: Self-pay | Admitting: Internal Medicine

## 2021-07-17 DIAGNOSIS — J410 Simple chronic bronchitis: Secondary | ICD-10-CM

## 2021-07-17 NOTE — Telephone Encounter (Signed)
Requested medication (s) are due for refill today: -  Requested medication (s) are on the active medication list: ended 01/06/21  Last refill:  12/30/20  Future visit scheduled: no  Notes to clinic:  pt needs appointment- med not assigned to a protocol   Requested Prescriptions  Pending Prescriptions Disp Refills   levofloxacin (LEVAQUIN) 500 MG tablet [Pharmacy Med Name: LEVOFLOXACIN 500 MG TAB] 7 tablet 0    Sig: TAKE 1 TABLET DAILY FOR 7 DAYS      Off-Protocol Failed - 07/17/2021  9:43 AM      Failed - Medication not assigned to a protocol, review manually.      Passed - Valid encounter within last 12 months    Recent Outpatient Visits           2 weeks ago Annual physical exam   [facility] Glean Hess, [facility]   6 months ago Chronic bronchitis, simple [facility])   [facility] Glean Hess, MD   9 months ago Left thyroid nodule   [facility] Glean Hess, MD   1 year ago Annual physical exam   [facility] Glean Hess, MD   1 year ago Current moderate episode of major depressive disorder without prior episode [facility])   [facility] Glean Hess, MD

## 2021-07-19 NOTE — Telephone Encounter (Signed)
Left voice mail with patient to call back to set up appointment for refill

## 2021-07-19 NOTE — Telephone Encounter (Signed)
Pt needs an appointment for antibiotics.  KP

## 2021-09-07 ENCOUNTER — Ambulatory Visit
Admission: RE | Admit: 2021-09-07 | Discharge: 2021-09-07 | Disposition: A | Discharge status: Home / Self Care | Payer: BLUE CROSS/BLUE SHIELD | Source: Ambulatory Visit | Attending: Obstetrics & Gynecology | Admitting: Obstetrics & Gynecology

## 2021-09-07 ENCOUNTER — Other Ambulatory Visit: Payer: Self-pay

## 2021-09-07 DIAGNOSIS — Z1231 Encounter for screening mammogram for malignant neoplasm of breast: Secondary | ICD-10-CM | POA: Insufficient documentation

## 2021-09-13 ENCOUNTER — Other Ambulatory Visit: Payer: Self-pay | Admitting: Obstetrics & Gynecology

## 2021-09-17 ENCOUNTER — Other Ambulatory Visit: Payer: Self-pay | Admitting: Obstetrics & Gynecology

## 2021-09-17 ENCOUNTER — Other Ambulatory Visit: Payer: Self-pay | Admitting: Internal Medicine

## 2021-09-17 DIAGNOSIS — R928 Other abnormal and inconclusive findings on diagnostic imaging of breast: Secondary | ICD-10-CM

## 2021-09-21 ENCOUNTER — Ambulatory Visit
Admission: RE | Admit: 2021-09-21 | Discharge: 2021-09-21 | Disposition: A | Discharge status: Home / Self Care | Payer: BLUE CROSS/BLUE SHIELD | Source: Ambulatory Visit | Attending: Obstetrics & Gynecology | Admitting: Obstetrics & Gynecology

## 2021-09-21 ENCOUNTER — Other Ambulatory Visit: Payer: Self-pay

## 2021-09-21 DIAGNOSIS — R928 Other abnormal and inconclusive findings on diagnostic imaging of breast: Secondary | ICD-10-CM | POA: Insufficient documentation

## 2021-09-22 ENCOUNTER — Other Ambulatory Visit: Payer: Self-pay | Admitting: Internal Medicine

## 2021-09-22 DIAGNOSIS — R928 Other abnormal and inconclusive findings on diagnostic imaging of breast: Secondary | ICD-10-CM

## 2021-09-22 DIAGNOSIS — N6310 Unspecified lump in the right breast, unspecified quadrant: Secondary | ICD-10-CM

## 2021-09-28 ENCOUNTER — Other Ambulatory Visit: Payer: Self-pay

## 2021-09-28 ENCOUNTER — Ambulatory Visit
Admission: RE | Admit: 2021-09-28 | Discharge: 2021-09-28 | Disposition: A | Discharge status: Home / Self Care | Payer: BLUE CROSS/BLUE SHIELD | Source: Ambulatory Visit | Attending: Internal Medicine | Admitting: Internal Medicine

## 2021-09-28 DIAGNOSIS — R928 Other abnormal and inconclusive findings on diagnostic imaging of breast: Secondary | ICD-10-CM | POA: Diagnosis not present

## 2021-09-28 DIAGNOSIS — N6310 Unspecified lump in the right breast, unspecified quadrant: Secondary | ICD-10-CM | POA: Insufficient documentation

## 2021-09-28 HISTORY — PX: BREAST BIOPSY: SHX20

## 2021-09-29 LAB — SURGICAL PATHOLOGY

## 2021-10-02 ENCOUNTER — Encounter: Payer: Self-pay | Admitting: Internal Medicine

## 2021-10-02 ENCOUNTER — Other Ambulatory Visit: Payer: Self-pay | Admitting: Internal Medicine

## 2021-10-02 NOTE — Telephone Encounter (Signed)
Requested medication (s) are due for refill today: yes  Requested medication (s) are on the active medication list: see note below  Last refill:  09/17/21 #90  Future visit scheduled: no Requested Refill  buPROPion (WELLBUTRIN XL) 150 MG 24 hr tablet [Pharmacy Med Name: BUPROPION HCL ER (XL) 150 MG TAB]  TAKE (1) TABLET BY MOUTH EVERY DAY  Dispense: 90 tablet     Refills: 0      Notes to pharmacy: PER PATIENT: DOSE INCREASED TO 2 TABS DAILY. PLEASE CONFIRM CURRENT DOSE AND SEND NEW RX IF NEEDED. THANKS   Notes to clinic: see above   Requested Prescriptions  Pending Prescriptions Disp Refills   buPROPion (WELLBUTRIN XL) 150 MG 24 hr tablet [Pharmacy Med Name: BUPROPION HCL ER (XL) 150 MG TAB] 90 tablet 0    Sig: TAKE (1) TABLET BY MOUTH EVERY DAY     Psychiatry: Antidepressants - bupropion Passed - 10/02/2021 12:49 PM      Passed - Completed PHQ-2 or PHQ-9 in the last 360 days      Passed - Last BP in normal range    BP Readings from Last 1 Encounters:  07/01/21 132/70          Passed - Valid encounter within last 6 months    Recent Outpatient Visits           3 months ago Annual physical exam   [facility] Glean Hess, MD   9 months ago Chronic bronchitis, simple [facility])   [facility] Glean Hess, MD   11 months ago Left thyroid nodule   [facility] Glean Hess, MD   1 year ago Annual physical exam   [facility] Glean Hess, MD   2 years ago Current moderate episode of major depressive disorder without prior episode [facility])   [facility] Glean Hess, MD

## 2021-10-03 ENCOUNTER — Other Ambulatory Visit: Payer: Self-pay | Admitting: Internal Medicine

## 2021-10-03 DIAGNOSIS — F321 Major depressive disorder, single episode, moderate: Secondary | ICD-10-CM

## 2021-10-03 MED ORDER — BUPROPION HCL ER (XL) 150 MG PO TB24: 150.0000 mg | ORAL_TABLET | Freq: Two times a day (BID) | ORAL | 1 refills | Status: DC

## 2022-01-14 ENCOUNTER — Encounter: Payer: Self-pay | Admitting: Internal Medicine

## 2022-01-14 ENCOUNTER — Ambulatory Visit: Payer: No Typology Code available for payment source | Admitting: Internal Medicine

## 2022-01-14 ENCOUNTER — Other Ambulatory Visit: Payer: Self-pay

## 2022-01-14 VITALS — BP 112/78 | HR 97 | Temp 98.4°F | Ht 62.0 in | Wt 169.0 lb

## 2022-01-14 DIAGNOSIS — J029 Acute pharyngitis, unspecified: Secondary | ICD-10-CM

## 2022-01-14 MED ORDER — NYSTATIN 100000 UNIT/ML MT SUSP: 5.0000 mL | Freq: Four times a day (QID) | OROMUCOSAL | 0 refills | Status: DC

## 2022-01-14 NOTE — Progress Notes (Signed)
Date:  01/14/2022   Name:  Nancy Vega   DOB:  [birth year]   MRN:  [id number]   Chief Complaint: Sore Throat  Sore Throat  This is a new problem. Episode onset: x2 days. The problem has been gradually worsening. The pain is worse on the right side. There has been no fever. The pain is at a severity of 6/10. The pain is mild. Associated symptoms include ear pain and trouble swallowing. Pertinent negatives include no headaches or shortness of breath. Associated symptoms comments: Sensitivity to salty foods. Treatments tried: zpak every other day. The treatment provided no relief.   Lab Results  Component Value Date   NA 142 07/01/2021   K 4.6 07/01/2021   CO2 21 07/01/2021   GLUCOSE 96 07/01/2021   BUN 15 07/01/2021   CREATININE 0.91 07/01/2021   CALCIUM 9.5 07/01/2021   EGFR 80 07/01/2021   GFRNONAA 84 10/12/2020   Lab Results  Component Value Date   CHOL 229 (H) 07/01/2021   HDL 58 07/01/2021   LDLCALC 151 (H) 07/01/2021   TRIG 111 07/01/2021   CHOLHDL 3.9 07/01/2021   Lab Results  Component Value Date   TSH 1.460 07/01/2021   No results found for: HGBA1C Lab Results  Component Value Date   WBC 9.0 07/01/2021   HGB 13.7 07/01/2021   HCT 40.6 07/01/2021   MCV 93 07/01/2021   PLT 351 07/01/2021   Lab Results  Component Value Date   ALT 19 07/01/2021   AST 20 07/01/2021   ALKPHOS 115 07/01/2021   BILITOT 0.4 07/01/2021   No results found for: 25OHVITD2, 25OHVITD3, VD25OH   Review of Systems  Constitutional:  Negative for appetite change, fatigue and fever.  HENT:  Positive for ear pain, postnasal drip and trouble swallowing. Negative for mouth sores, sinus pressure and sinus pain.   Respiratory:  Negative for chest tightness, shortness of breath and wheezing.   Cardiovascular:  Negative for chest pain.  Neurological:  Negative for dizziness, light-headedness and headaches.   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   Chronic pain of both knees 09/23/2019   Current moderate episode of major depressive disorder without prior episode ([place]) 08/12/2019   Rupture of anterior cruciate ligament of right knee 08/12/2019   Left thyroid nodule 06/17/2019   Chronic bronchitis, simple ([place]) 07/18/2018   Fibrocystic breast changes, bilateral 03/21/2016   Anxiety disorder 07/13/2015   Asthma in adult, mild intermittent, uncomplicated 78/67/5449   Essential tremor 07/13/2015   Irritable bowel syndrome with constipation 07/13/2015    Allergies  Allergen Reactions   Codeine Nausea And Vomiting   Doxycycline Nausea And Vomiting   Oxycodone Hives   Amoxicillin Hives   Cefdinir Other (See Comments)   Hydrocodone Other (See Comments)    syncope    Iodine Solution [Povidone Iodine]     Allergy to cleaning Iodine solution    Nsaids Other (See Comments)    Tremor intensity   Tamiflu [Oseltamivir] Hives   Zithromax [Azithromycin] Hives and Other (See Comments)   Buspirone Other (See Comments)    Dizziness and fatigue    Past Surgical History:  Procedure Laterality Date   APPENDECTOMY  06/2015   BREAST BIOPSY Left 04/2016   fibroadenoma/ us/bx   BREAST BIOPSY Right 2012   neg   BREAST BIOPSY Right 09/28/2021  u/s bx, ribbon clip, 9:00 9 cmfn, path pend   BREAST EXCISIONAL BIOPSY Right 2016   neg   KNEE SURGERY Right 2011   TONSILLECTOMY Bilateral     Social History   Tobacco Use   Smoking status: Never   Smokeless tobacco: Never  Substance Use Topics   Alcohol use: Yes    Alcohol/week: 2.0 standard drinks    Types: 2 Standard drinks or equivalent per week    Comment: occassion   Drug use: No     Medication list has been reviewed and updated.  Current Meds  Medication Sig   albuterol (VENTOLIN HFA) 108 (90 Base) MCG/ACT inhaler Inhale into the lungs.   azelastine (ASTELIN)  0.1 % nasal spray Place into both nostrils.   azithromycin (ZITHROMAX) 250 MG tablet Take by mouth. Dr. Sharlene Motts   budesonide (PULMICORT) 0.5 MG/2ML nebulizer solution Take by nebulization.   buPROPion (WELLBUTRIN XL) 150 MG 24 hr tablet Take 1 tablet (150 mg total) by mouth in the morning and at bedtime. (Patient taking differently: Take 150 mg by mouth daily. 2 tablets in the AM)   fluticasone (FLONASE) 50 MCG/ACT nasal spray Place 2 sprays into both nostrils daily. (Patient taking differently: Place 2 sprays into both nostrils as needed.)   fluticasone furoate-vilanterol (BREO ELLIPTA) 100-25 MCG/INH AEPB Inhale into the lungs.   levocetirizine (XYZAL) 5 MG tablet Take 5 mg by mouth daily.   Levonorgestrel (MIRENA, 52 MG, IU) Mirena 20 mcg/24 hours (7 yrs) 52 mg intrauterine device  Take by intrauterine route.   magic mouthwash (nystatin, hydrocortisone, diphenhydrAMINE) suspension Swish and spit 5 mLs 4 (four) times daily.   meloxicam (MOBIC) 15 MG tablet Take 15 mg by mouth daily. Dr. Casper Harrison [facility]   montelukast (SINGULAIR) 10 MG tablet Take 10 mg by mouth daily.   primidone (MYSOLINE) 50 MG tablet Take 100 mg by mouth 2 (two) times daily.    tretinoin (RETIN-A) 0.05 % cream Apply topically as needed.    PHQ 2/9 Scores 01/14/2022 07/01/2021 12/30/2020 10/12/2020  PHQ - 2 Score '1 2 2 4  ' PHQ- 9 Score '3 7 7 6    ' GAD 7 : Generalized Anxiety Score 01/14/2022 07/01/2021 12/30/2020 10/12/2020  Nervous, Anxious, on Edge 0 '1 3 1  ' Control/stop worrying 0 '1 3 1  ' Worry too much - different things 0 '1 3 1  ' Trouble relaxing 0 1 0 0  Restless 0 0 0 0  Easily annoyed or irritable 0 0 0 0  Afraid - awful might happen 0 0 0 0  Total GAD 7 Score 0 '4 9 3  ' Anxiety Difficulty - Not difficult at all - Not difficult at all    BP Readings from Last 3 Encounters:  01/14/22 112/78  07/01/21 132/70  12/30/20 122/86    Physical Exam Vitals and nursing note reviewed.  Constitutional:      General:  She is not in acute distress.    Appearance: She is well-developed.  HENT:     Head: Normocephalic and atraumatic.     Right Ear: Tympanic membrane and ear canal normal.     Left Ear: Tympanic membrane and ear canal normal.     Nose:     Right Sinus: No maxillary sinus tenderness or frontal sinus tenderness.     Left Sinus: No maxillary sinus tenderness or frontal sinus tenderness.     Mouth/Throat:     Mouth: Mucous membranes are moist.     Tongue: No lesions.  Palate: No mass.     Pharynx: Posterior oropharyngeal erythema (mild) present.  Cardiovascular:     Rate and Rhythm: Normal rate and regular rhythm.  Pulmonary:     Effort: Pulmonary effort is normal. No respiratory distress.     Breath sounds: Normal breath sounds. No decreased breath sounds, wheezing or rhonchi.  Skin:    General: Skin is warm and dry.     Findings: No rash.  Neurological:     Mental Status: She is alert and oriented to person, place, and time.  Psychiatric:        Mood and Affect: Mood normal.        Behavior: Behavior normal.    Wt Readings from Last 3 Encounters:  01/14/22 169 lb (76.7 kg)  07/01/21 163 lb (73.9 kg)  12/30/20 159 lb (72.1 kg)    BP 112/78    Pulse 97    Temp 98.4 F (36.9 C) (Oral)    Ht '5\' 2"'  (1.575 m)    Wt 169 lb (76.7 kg)    SpO2 97%    BMI 30.91 kg/m   Assessment and Plan: 1. Pharyngitis, unspecified etiology Does not appear to be bacterial since she is on chronic azithromycin. Treat symptomatically for viral etiology - warm liquids, lozenges, tylenol Will also treat for possible thrush - magic mouthwash (nystatin, hydrocortisone, diphenhydrAMINE) suspension; Swish and spit 5 mLs 4 (four) times daily.  Dispense: 270 mL; Refill: 0   Partially dictated using Editor, commissioning. Any errors are unintentional.  Halina Maidens, MD [facility]  01/14/2022

## 2022-03-22 ENCOUNTER — Other Ambulatory Visit: Payer: Self-pay | Admitting: Internal Medicine

## 2022-03-22 DIAGNOSIS — F321 Major depressive disorder, single episode, moderate: Secondary | ICD-10-CM

## 2022-03-23 NOTE — Telephone Encounter (Signed)
Requested Prescriptions  ?Pending Prescriptions Disp Refills  ?? buPROPion (WELLBUTRIN XL) 150 MG 24 hr tablet [Pharmacy Med Name: BUPROPION HCL ER (XL) 150 MG TAB] 180 tablet 1  ?  Sig: TAKE 1 TABLET BY MOUTH IN THE MORNING AND AT BEDTIME.  ?  ? Psychiatry: Antidepressants - bupropion Failed - 03/22/2022  9:38 AM  ?  ?  Failed - Valid encounter within last 6 months  ?  Recent Outpatient Visits   ?      ? 2 months ago Pharyngitis, unspecified etiology  ? [facility] Glean Hess, MD  ? 8 months ago Annual physical exam  ? [facility] Glean Hess, MD  ? 1 year ago Chronic bronchitis, simple ([place])  ? [facility] Glean Hess, MD  ? 1 year ago Left thyroid nodule  ? [facility] Glean Hess, MD  ? 1 year ago Annual physical exam  ? [facility] Glean Hess, MD  ?  ?  ? ?  ?  ?  Passed - Cr in normal range and within 360 days  ?  Creatinine, Ser  ?Date Value Ref Range Status  ?07/01/2021 0.91 0.57 - 1.00 mg/dL Final  ?   ?  ?  Passed - AST in normal range and within 360 days  ?  AST  ?Date Value Ref Range Status  ?07/01/2021 20 0 - 40 IU/L Final  ?   ?  ?  Passed - ALT in normal range and within 360 days  ?  ALT  ?Date Value Ref Range Status  ?07/01/2021 19 0 - 32 IU/L Final  ?   ?  ?  Passed - Completed PHQ-2 or PHQ-9 in the last 360 days  ?  ?  Passed - Last BP in normal range  ?  BP Readings from Last 1 Encounters:  ?01/14/22 112/78  ?   ?  ?  ? ? ?

## 2022-06-16 ENCOUNTER — Other Ambulatory Visit: Payer: Self-pay | Admitting: Obstetrics & Gynecology

## 2022-06-16 DIAGNOSIS — Z1231 Encounter for screening mammogram for malignant neoplasm of breast: Secondary | ICD-10-CM

## 2022-06-16 LAB — RESULTS CONSOLE HPV: CHL HPV: NEGATIVE

## 2022-06-16 LAB — HM PAP SMEAR: HM Pap smear: NORMAL

## 2022-06-22 ENCOUNTER — Other Ambulatory Visit: Payer: Self-pay | Admitting: Internal Medicine

## 2022-06-22 DIAGNOSIS — F321 Major depressive disorder, single episode, moderate: Secondary | ICD-10-CM

## 2022-06-22 NOTE — Telephone Encounter (Signed)
Pt has future OV scheduled, will refill until OV.  Requested Prescriptions  Pending Prescriptions Disp Refills  . buPROPion (WELLBUTRIN XL) 150 MG 24 hr tablet [Pharmacy Med Name: BUPROPION HCL ER (XL) 150 MG TAB] 180 tablet 0    Sig: TAKE 1 TABLET BY MOUTH IN THE MORNING AND AT BEDTIME.     Psychiatry: Antidepressants - bupropion Failed - 06/22/2022  8:04 AM      Failed - Valid encounter within last 6 months    Recent Outpatient Visits          5 months ago Pharyngitis, unspecified etiology   [facility] Glean Hess, MD   11 months ago Annual physical exam   [facility] Glean Hess, MD   1 year ago Chronic bronchitis, simple [facility])   [facility] Glean Hess, MD   1 year ago Left thyroid nodule   [facility], [facility]   1 year ago Annual physical exam   [facility] Glean Hess, MD      Future Appointments            In 3 months Glean Hess, MD [facility], [place] in normal range and within 360 days    Creatinine, Ser  Date Value Ref Range Status  07/01/2021 0.91 0.57 - 1.00 mg/dL Final         Passed - AST in normal range and within 360 days    AST  Date Value Ref Range Status  07/01/2021 20 0 - 40 IU/L Final         Passed - ALT in normal range and within 360 days    ALT  Date Value Ref Range Status  07/01/2021 19 0 - 32 IU/L Final         Passed - Completed PHQ-2 or PHQ-9 in the last 360 days      Passed - Last BP in normal range    BP Readings from Last 1 Encounters:  01/14/22 112/78

## 2022-06-23 ENCOUNTER — Encounter: Payer: Self-pay | Admitting: Internal Medicine

## 2022-09-01 IMAGING — MG DIGITAL SCREENING BILAT W/ TOMO W/ CAD
8 series · 8 of 24 positions shown · non-contrast
Comparison: Previous exam(s).

CLINICAL DATA: Screening.

EXAM:
DIGITAL SCREENING BILATERAL MAMMOGRAM WITH TOMO AND CAD

[L MLO synth-2D]
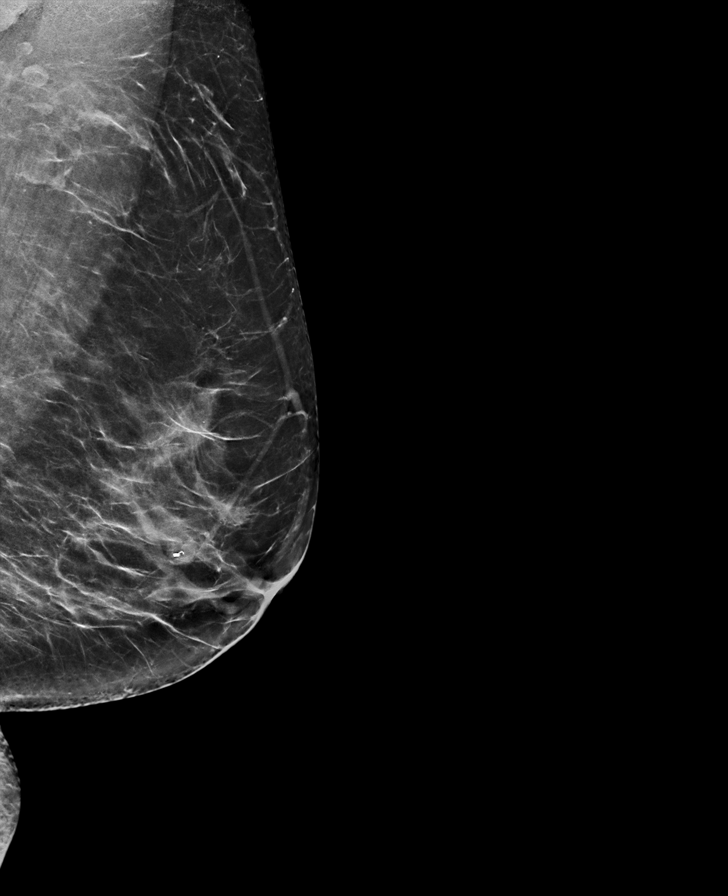

[R CC synth-2D]
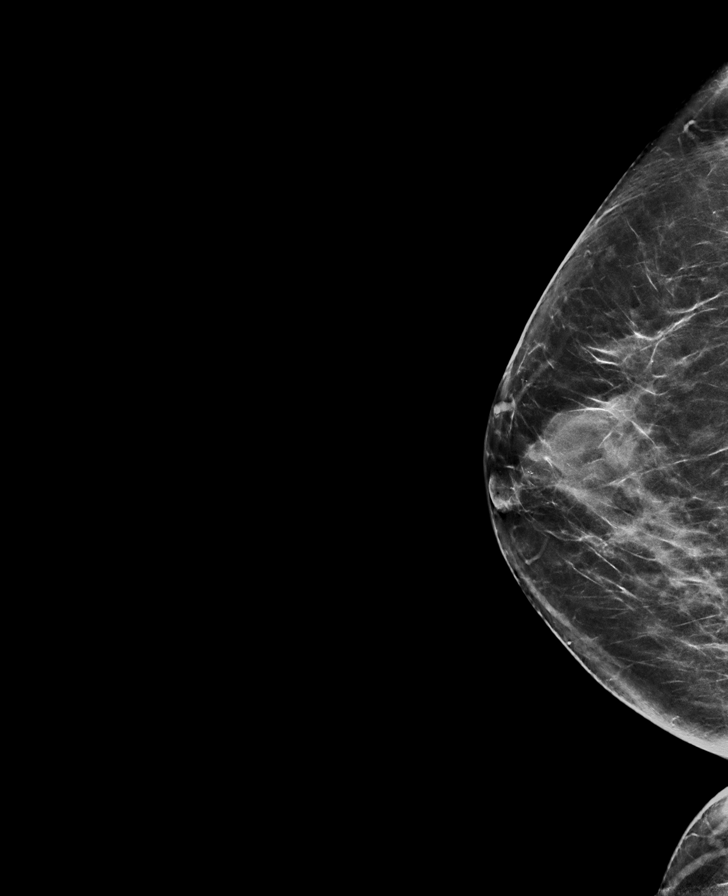

[R MLO synth-2D]
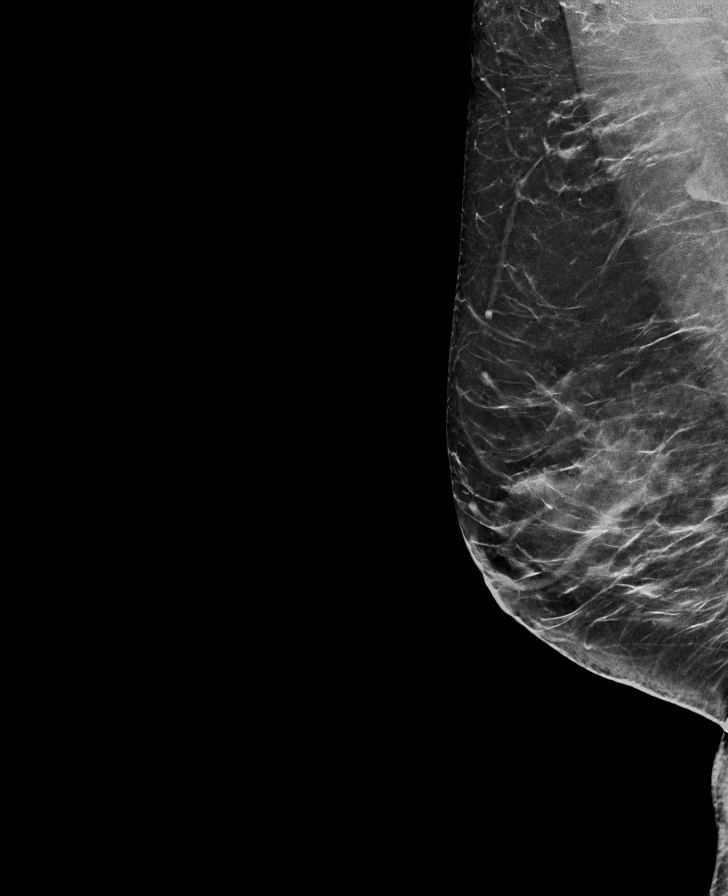

[L CC synth-2D]
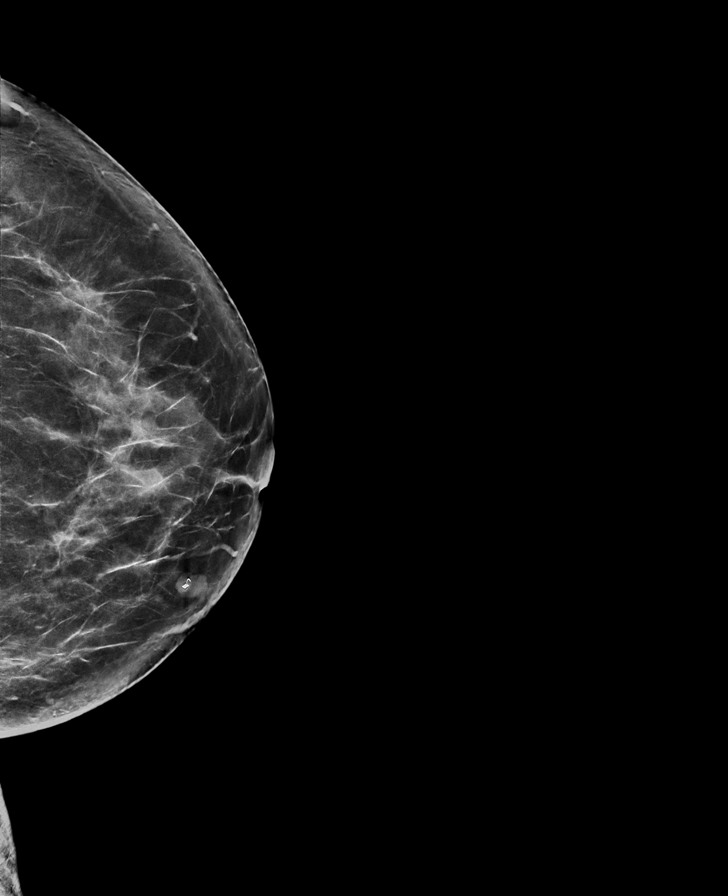

[R MLO tomo · tomo slice 37/72.0]
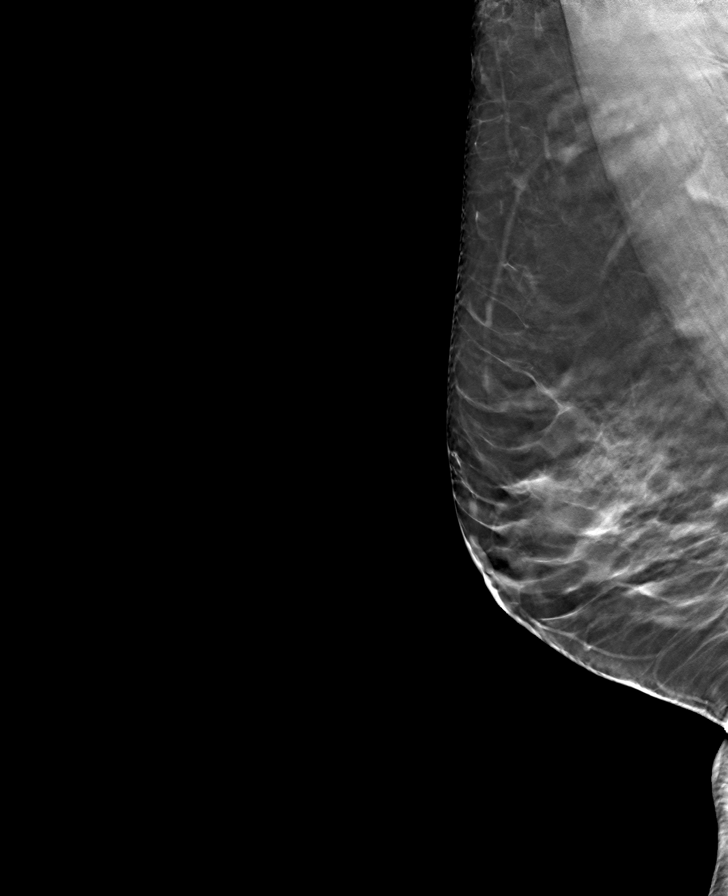

[L MLO tomo · tomo slice 39/76.0]
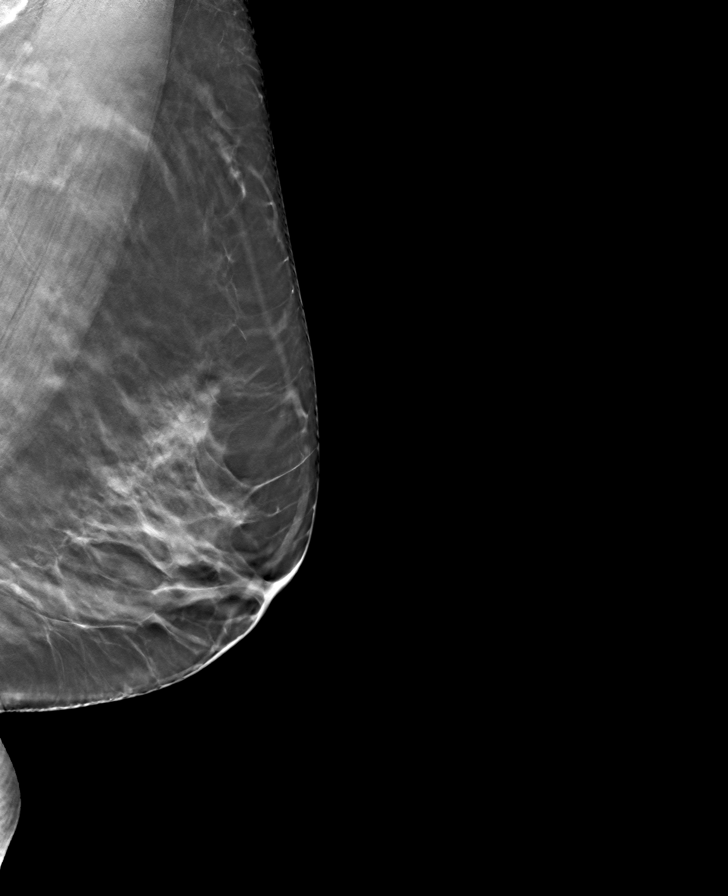

[R CC tomo · tomo slice 35/69.0]
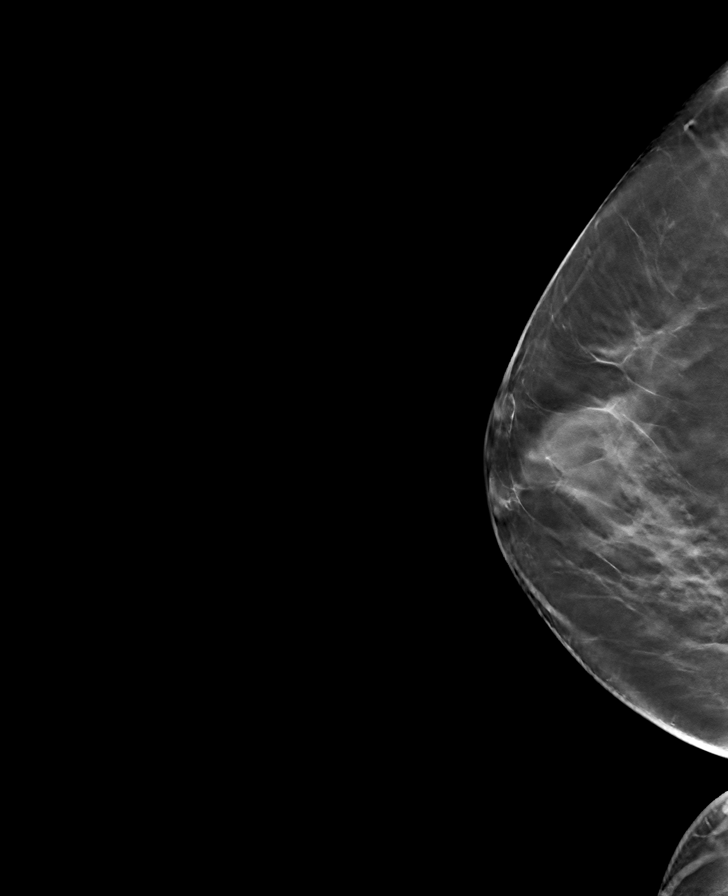

[L CC tomo · tomo slice 38/75.0]
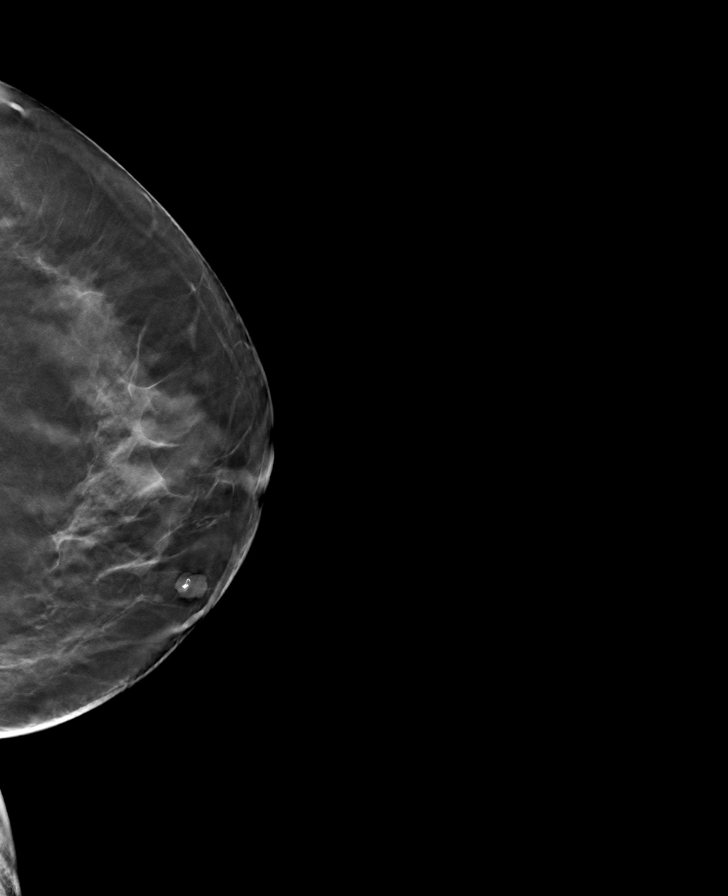

[8 of 24 positions shown; findings below may reference images not displayed]

ACR Breast Density Category c: The breast tissue is heterogeneously
dense, which may obscure small masses.
FINDINGS: There are no findings suspicious for malignancy. Images were
processed with CAD.
IMPRESSION: No mammographic evidence of malignancy. A result letter of this
screening mammogram will be mailed directly to the patient.

RECOMMENDATION:
Screening mammogram in one year. (Code:[id number])

BI-RADS CATEGORY  1: Negative.

## 2022-09-30 ENCOUNTER — Other Ambulatory Visit: Payer: Self-pay | Admitting: Internal Medicine

## 2022-10-03 NOTE — Progress Notes (Unsigned)
Date:  10/04/2022   Name:  Nancy Vega   DOB:  January 21, 1976   MRN:  [id number]   Chief Complaint: No chief complaint on file. Nancy Vega is a 45 y.o. female who presents today for her Complete Annual Exam. She feels {DESC; WELL/FAIRLY WELL/POORLY:18703}. She reports exercising ***. She reports she is sleeping {DESC; WELL/FAIRLY WELL/POORLY:18703}. Breast complaints ***.  Mammogram: 09/2021 - biopsy benign DEXA: none Pap smear: 07/2019 neg/HPV+  sees GYN Colonoscopy: 12/2007  Health Maintenance Due  Topic Date Due   HIV Screening  Never done   COLONOSCOPY (Pts 45-52yr Insurance coverage will need to be confirmed)  10/28/2021   COVID-19 Vaccine (5 - Pfizer risk series) 01/21/2022   INFLUENZA VACCINE  Never done   PAP SMEAR-Modifier  07/24/2022   MAMMOGRAM  09/21/2022    Immunization History  Administered Date(s) Administered   HPV 9-valent 09/09/2019, 11/18/2019, 02/21/2020   PFIZER Comirnaty(Gray Top)Covid-19 Tri-Sucrose Vaccine 02/21/2020, 03/28/2020, 10/31/2020, 11/26/2021   PNEUMOCOCCAL CONJUGATE-20 07/16/2021   Tdap 12/19/2012, 10/02/2013    HPI  Lab Results  Component Value Date   NA 142 07/01/2021   K 4.6 07/01/2021   CO2 21 07/01/2021   GLUCOSE 96 07/01/2021   BUN 15 07/01/2021   CREATININE 0.91 07/01/2021   CALCIUM 9.5 07/01/2021   EGFR 80 07/01/2021   GFRNONAA 84 10/12/2020   Lab Results  Component Value Date   CHOL 229 (H) 07/01/2021   HDL 58 07/01/2021   LDLCALC 151 (H) 07/01/2021   TRIG 111 07/01/2021   CHOLHDL 3.9 07/01/2021   Lab Results  Component Value Date   TSH 1.460 07/01/2021   No results found for: "HGBA1C" Lab Results  Component Value Date   WBC 9.0 07/01/2021   HGB 13.7 07/01/2021   HCT 40.6 07/01/2021   MCV 93 07/01/2021   PLT 351 07/01/2021   Lab Results  Component Value Date   ALT 19 07/01/2021   AST 20 07/01/2021   ALKPHOS 115 07/01/2021   BILITOT 0.4 07/01/2021   No results found for:  "25OHVITD2", "25OHVITD3", "VD25OH"   Review of Systems  Constitutional:  Negative for chills, fatigue and fever.  HENT:  Negative for congestion, hearing loss, tinnitus, trouble swallowing and voice change.   Eyes:  Negative for visual disturbance.  Respiratory:  Negative for cough, chest tightness, shortness of breath and wheezing.   Cardiovascular:  Negative for chest pain, palpitations and leg swelling.  Gastrointestinal:  Negative for abdominal pain, constipation, diarrhea and vomiting.  Endocrine: Negative for polydipsia and polyuria.  Genitourinary:  Negative for dysuria, frequency, genital sores, vaginal bleeding and vaginal discharge.  Musculoskeletal:  Negative for arthralgias, gait problem and joint swelling.  Skin:  Negative for color change and rash.  Neurological:  Negative for dizziness, tremors, light-headedness and headaches.  Hematological:  Negative for adenopathy. Does not bruise/bleed easily.  Psychiatric/Behavioral:  Negative for dysphoric mood and sleep disturbance. The patient is not nervous/anxious.     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, right 10/07/2019   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   Chronic pain of both knees 09/23/2019   Current moderate episode of major depressive disorder [place] prior episode [place] 08/12/[place]   Rupture of anterior cruciate ligament of right knee 08/12/2019   Left thyroid nodule 06/17/2019   [place] 07/18/2018   Fibrocystic breast changes, bilateral 03/21/2016   Anxiety disorder 07/13/2015  Asthma in adult, mild intermittent, uncomplicated 60/67/7034   Essential tremor 07/13/2015   Irritable bowel syndrome with constipation 07/13/2015    Allergies  Allergen Reactions   Codeine Nausea And Vomiting   Doxycycline Nausea And Vomiting   Oxycodone Hives   Amoxicillin Hives   Cefdinir Other (See Comments)   Hydrocodone  Other (See Comments)    syncope    Iodine Solution [Povidone Iodine]     Allergy to cleaning Iodine solution    Nsaids Other (See Comments)    Tremor intensity   Tamiflu [Oseltamivir] Hives   Zithromax [Azithromycin] Hives and Other (See Comments)   Buspirone Other (See Comments)    Dizziness and fatigue    Past Surgical History:  Procedure Laterality Date   APPENDECTOMY  06/2015   BREAST BIOPSY Left 04/2016   fibroadenoma/ us/bx   BREAST BIOPSY Right 2012   neg   BREAST BIOPSY Right 09/28/2021   u/s bx, ribbon clip, 9:00 9 cmfn, path pend   BREAST EXCISIONAL BIOPSY Right 2016   neg   KNEE SURGERY Right 2011   TONSILLECTOMY Bilateral     Social History   Tobacco Use   Smoking status: Never   Smokeless tobacco: Never  Substance Use Topics   Alcohol use: Yes    Alcohol/week: 2.0 standard drinks of alcohol    Types: 2 Standard drinks or equivalent per week    Comment: occassion   Drug use: No     Medication list has been reviewed and updated.  No outpatient medications have been marked as taking for the 10/04/22 encounter (Appointment) with Glean Hess, MD.       01/14/2022   11:11 AM 07/01/2021    8:59 AM 12/30/2020   11:50 AM 10/12/2020    2:01 PM  GAD 7 : Generalized Anxiety Score  Nervous, Anxious, on Edge 0 '1 3 1  ' Control/stop worrying 0 '1 3 1  ' Worry too much - different things 0 '1 3 1  ' Trouble relaxing 0 1 0 0  Restless 0 0 0 0  Easily annoyed or irritable 0 0 0 0  Afraid - awful might happen 0 0 0 0  Total GAD 7 Score 0 '4 9 3  ' Anxiety Difficulty  Not difficult at all  Not difficult at all       01/14/2022   11:11 AM 07/01/2021    8:58 AM 12/30/2020   11:50 AM  Depression screen PHQ 2/9  Decreased Interest 0 1 1  Down, Depressed, Hopeless '1 1 1  ' PHQ - 2 Score '1 2 2  ' Altered sleeping '2 1 3  ' Tired, decreased energy 0 1 2  Change in appetite 0 1 0  Feeling bad or failure about yourself  0 1 0  Trouble concentrating 0 0 0  Moving slowly or  fidgety/restless 0 1 0  Suicidal thoughts 0 0 0  PHQ-9 Score '3 7 7    ' BP Readings from Last 3 Encounters:  01/14/22 112/78  07/01/21 132/70  12/30/20 122/86    Physical Exam Vitals and nursing note reviewed.  Constitutional:      General: She is not in acute distress.    Appearance: She is well-developed.  HENT:     Head: Normocephalic and atraumatic.     Right Ear: Tympanic membrane and ear canal normal.     Left Ear: Tympanic membrane and ear canal normal.     Nose:     Right Sinus: No maxillary sinus tenderness.  Left Sinus: No maxillary sinus tenderness.  Eyes:     General: No scleral icterus.       Right eye: No discharge.        Left eye: No discharge.     Conjunctiva/sclera: Conjunctivae normal.  Neck:     Thyroid: No thyromegaly.     Vascular: No carotid bruit.  Cardiovascular:     Rate and Rhythm: Normal rate and regular rhythm.     Pulses: Normal pulses.     Heart sounds: Normal heart sounds.  Pulmonary:     Effort: Pulmonary effort is normal. No respiratory distress.     Breath sounds: No wheezing.  Chest:  Breasts:    Right: No mass, nipple discharge, skin change or tenderness.     Left: No mass, nipple discharge, skin change or tenderness.  Abdominal:     General: Bowel sounds are normal.     Palpations: Abdomen is soft.     Tenderness: There is no abdominal tenderness.  Musculoskeletal:     Cervical back: Normal range of motion. No erythema.     Right lower leg: No edema.     Left lower leg: No edema.  Lymphadenopathy:     Cervical: No cervical adenopathy.  Skin:    General: Skin is warm and dry.     Findings: No rash.  Neurological:     Mental Status: She is alert and oriented to person, place, and time.     Cranial Nerves: No cranial nerve deficit.     Sensory: No sensory deficit.     Deep Tendon Reflexes: Reflexes are normal and symmetric.  Psychiatric:        Attention and Perception: Attention normal.        Mood and Affect: Mood  normal.     Wt Readings from Last 3 Encounters:  01/14/22 169 lb (76.7 kg)  07/01/21 163 lb (73.9 kg)  12/30/20 159 lb (72.1 kg)    There were no vitals taken for this visit.  Assessment and Plan:

## 2022-10-04 ENCOUNTER — Ambulatory Visit (INDEPENDENT_AMBULATORY_CARE_PROVIDER_SITE_OTHER): Payer: No Typology Code available for payment source | Admitting: Internal Medicine

## 2022-10-04 ENCOUNTER — Encounter: Payer: Self-pay | Admitting: Internal Medicine

## 2022-10-04 VITALS — BP 124/72 | HR 91 | Ht 62.0 in | Wt 153.0 lb

## 2022-10-04 DIAGNOSIS — M26629 Arthralgia of temporomandibular joint, unspecified side: Secondary | ICD-10-CM

## 2022-10-04 DIAGNOSIS — G250 Essential tremor: Secondary | ICD-10-CM

## 2022-10-04 DIAGNOSIS — Z131 Encounter for screening for diabetes mellitus: Secondary | ICD-10-CM

## 2022-10-04 DIAGNOSIS — F321 Major depressive disorder, single episode, moderate: Secondary | ICD-10-CM | POA: Diagnosis not present

## 2022-10-04 DIAGNOSIS — J4520 Mild intermittent asthma, uncomplicated: Secondary | ICD-10-CM | POA: Diagnosis not present

## 2022-10-04 DIAGNOSIS — Z1231 Encounter for screening mammogram for malignant neoplasm of breast: Secondary | ICD-10-CM

## 2022-10-04 DIAGNOSIS — Z0000 Encounter for general adult medical examination without abnormal findings: Secondary | ICD-10-CM | POA: Diagnosis not present

## 2022-10-04 DIAGNOSIS — E041 Nontoxic single thyroid nodule: Secondary | ICD-10-CM

## 2022-10-04 DIAGNOSIS — E785 Hyperlipidemia, unspecified: Secondary | ICD-10-CM

## 2022-10-04 DIAGNOSIS — J410 Simple chronic bronchitis: Secondary | ICD-10-CM

## 2022-10-04 DIAGNOSIS — K581 Irritable bowel syndrome with constipation: Secondary | ICD-10-CM

## 2022-10-04 DIAGNOSIS — Z1211 Encounter for screening for malignant neoplasm of colon: Secondary | ICD-10-CM

## 2022-10-04 MED ORDER — BUPROPION HCL ER (XL) 150 MG PO TB24: ORAL_TABLET | ORAL | 1 refills | Status: DC

## 2022-10-04 NOTE — Patient Instructions (Addendum)
Call [facility] to schedule your mammogram at [phone].  Please have GYN send me Pap results.

## 2022-10-05 LAB — CBC WITH DIFFERENTIAL/PLATELET
Basophils Absolute: 0.1 10*3/uL (ref 0.0–0.2)
Basos: 1 %
EOS (ABSOLUTE): 0.1 10*3/uL (ref 0.0–0.4)
Eos: 1 %
Hematocrit: 39.1 % (ref 34.0–46.6)
Hemoglobin: 13.5 g/dL (ref 11.1–15.9)
Immature Grans (Abs): 0 10*3/uL (ref 0.0–0.1)
Immature Granulocytes: 0 %
Lymphocytes Absolute: 2.5 10*3/uL (ref 0.7–3.1)
Lymphs: 27 %
MCH: 32.4 pg (ref 26.6–33.0)
MCHC: 34.5 g/dL (ref 31.5–35.7)
MCV: 94 fL (ref 79–97)
Monocytes Absolute: 0.5 10*3/uL (ref 0.1–0.9)
Monocytes: 6 %
Neutrophils Absolute: 6 10*3/uL (ref 1.4–7.0)
Neutrophils: 65 %
Platelets: 283 10*3/uL (ref 150–450)
RBC: 4.17 x10E6/uL (ref 3.77–5.28)
RDW: 12.4 % (ref 11.7–15.4)
WBC: 9.1 10*3/uL (ref 3.4–10.8)

## 2022-10-05 LAB — COMPREHENSIVE METABOLIC PANEL
ALT: 29 IU/L (ref 0–32)
AST: 23 IU/L (ref 0–40)
Albumin/Globulin Ratio: 2.1 (ref 1.2–2.2)
Albumin: 4.7 g/dL (ref 3.9–4.9)
Alkaline Phosphatase: 111 IU/L (ref 44–121)
BUN/Creatinine Ratio: 19 (ref 9–23)
BUN: 16 mg/dL (ref 6–24)
Bilirubin Total: 0.4 mg/dL (ref 0.0–1.2)
CO2: 19 mmol/L — ABNORMAL LOW (ref 20–29)
Calcium: 9.9 mg/dL (ref 8.7–10.2)
Chloride: 105 mmol/L (ref 96–106)
Creatinine, Ser: 0.86 mg/dL (ref 0.57–1.00)
Globulin, Total: 2.2 g/dL (ref 1.5–4.5)
Glucose: 90 mg/dL (ref 70–99)
Potassium: 4.4 mmol/L (ref 3.5–5.2)
Sodium: 141 mmol/L (ref 134–144)
Total Protein: 6.9 g/dL (ref 6.0–8.5)
eGFR: 85 mL/min/{1.73_m2} (ref >59)

## 2022-10-05 LAB — LIPID PANEL
Chol/HDL Ratio: 3.6 ratio (ref 0.0–4.4)
Cholesterol, Total: 206 mg/dL — ABNORMAL HIGH (ref 100–199)
HDL: 57 mg/dL (ref >39)
LDL Chol Calc (NIH): 128 mg/dL — ABNORMAL HIGH (ref 0–99)
Triglycerides: 119 mg/dL (ref 0–149)
VLDL Cholesterol Cal: 21 mg/dL (ref 5–40)

## 2022-10-05 LAB — TSH: TSH: 2.2 u[IU]/mL (ref 0.450–4.500)

## 2022-10-05 LAB — HEMOGLOBIN A1C
Est. average glucose Bld gHb Est-mCnc: 103 mg/dL
Hgb A1c MFr Bld: 5.2 % (ref 4.8–5.6)

## 2022-10-11 ENCOUNTER — Encounter: Payer: Self-pay | Admitting: Internal Medicine

## 2022-10-11 ENCOUNTER — Ambulatory Visit: Payer: No Typology Code available for payment source | Admitting: Internal Medicine

## 2022-10-11 VITALS — BP 122/78 | HR 73 | Ht 62.0 in | Wt 153.0 lb

## 2022-10-11 DIAGNOSIS — G43E09 Chronic migraine with aura, not intractable, without status migrainosus: Secondary | ICD-10-CM | POA: Diagnosis not present

## 2022-10-11 MED ORDER — SUMATRIPTAN SUCCINATE 100 MG PO TABS
100.0000 mg | ORAL_TABLET | ORAL | 0 refills | Status: DC | PRN
Start: 2022-10-11 — End: 2022-12-25

## 2022-10-11 NOTE — Progress Notes (Signed)
Date:  10/11/2022   Name:  Nancy Vega   DOB:  [birth year]   MRN:  [id number]   Chief Complaint: Migraine  Migraine  This is a recurrent problem. Episode frequency: comes when menstrual comes and last 1-2 weeks. The pain is located in the Occipital and right unilateral region. The pain radiates to the face. The quality of the pain is described as shooting, stabbing and pulsating. The pain is at a severity of 10/10. The pain is moderate. Associated symptoms include dizziness, eye pain, nausea, phonophobia, a visual change and vomiting. Treatments tried: excedrine migraine, aleve. The treatment provided mild relief.  She has never tried Imitrex or other triptan.  She had her pinnas pierced which was beneficial for years.  Lab Results  Component Value Date   NA 141 10/04/2022   K 4.4 10/04/2022   CO2 19 (L) 10/04/2022   GLUCOSE 90 10/04/2022   BUN 16 10/04/2022   CREATININE 0.86 10/04/2022   CALCIUM 9.9 10/04/2022   EGFR 85 10/04/2022   GFRNONAA 84 10/12/2020   Lab Results  Component Value Date   CHOL 206 (H) 10/04/2022   HDL 57 10/04/2022   LDLCALC 128 (H) 10/04/2022   TRIG 119 10/04/2022   CHOLHDL 3.6 10/04/2022   Lab Results  Component Value Date   TSH 2.200 10/04/2022   Lab Results  Component Value Date   HGBA1C 5.2 10/04/2022   Lab Results  Component Value Date   WBC 9.1 10/04/2022   HGB 13.5 10/04/2022   HCT 39.1 10/04/2022   MCV 94 10/04/2022   PLT 283 10/04/2022   Lab Results  Component Value Date   ALT 29 10/04/2022   AST 23 10/04/2022   ALKPHOS 111 10/04/2022   BILITOT 0.4 10/04/2022   No results found for: "25OHVITD2", "25OHVITD3", "VD25OH"   Review of Systems  Eyes:  Positive for pain.  Gastrointestinal:  Positive for nausea and vomiting.  Neurological:  Positive for dizziness and headaches.    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   S/P ACL reconstruction 02/24/2020   Chronic pain of both knees 09/23/2019   Current moderate episode of major depressive disorder without prior episode ([place]) 08/12/2019   Left thyroid nodule 06/17/2019   Chronic bronchitis, simple ([place]) 07/18/2018   Fibrocystic breast changes, bilateral 03/21/2016   Anxiety disorder 07/13/2015   Asthma in adult, mild intermittent, uncomplicated 26/83/4196   Essential tremor 07/13/2015   Irritable bowel syndrome with constipation 07/13/2015    Allergies  Allergen Reactions   Codeine Nausea And Vomiting   Doxycycline Nausea And Vomiting   Oxycodone Hives   Amoxicillin Hives   Cefdinir Other (See Comments)   Hydrocodone Other (See Comments)    syncope    Iodine Solution [Povidone Iodine]     Allergy to cleaning Iodine solution    Nsaids Other (See Comments)    Tremor intensity   Tamiflu [Oseltamivir] Hives   Zithromax [Azithromycin] Hives and Other (See Comments)   Buspirone Other (See Comments)    Dizziness and fatigue    Past Surgical History:  Procedure Laterality Date   APPENDECTOMY  06/2015   BREAST BIOPSY Left 04/2016   fibroadenoma/ us/bx   BREAST BIOPSY Right 2012   neg   BREAST BIOPSY Right 09/28/2021   u/s bx, ribbon clip, 9:00 9 cmfn, path pend   BREAST EXCISIONAL BIOPSY Right 2016  neg   KNEE SURGERY Right 2011   TONSILLECTOMY Bilateral     Social History   Tobacco Use   Smoking status: Never   Smokeless tobacco: Never  Substance Use Topics   Alcohol use: Yes    Alcohol/week: 2.0 standard drinks of alcohol    Types: 2 Standard drinks or equivalent per week    Comment: occassion   Drug use: No     Medication list has been reviewed and updated.  Current Meds  Medication Sig   albuterol (VENTOLIN HFA) 108 (90 Base) MCG/ACT inhaler Inhale into the lungs.   azelastine (ASTELIN) 0.1 % nasal spray Place into both nostrils.   budesonide (PULMICORT) 0.5 MG/2ML nebulizer solution Take by  nebulization.   buPROPion (WELLBUTRIN XL) 150 MG 24 hr tablet TAKE 1 TABLET BY MOUTH IN THE MORNING AND AT BEDTIME.   levocetirizine (XYZAL) 5 MG tablet Take 5 mg by mouth daily.   Levonorgestrel (MIRENA, 52 MG, IU) Mirena 20 mcg/24 hours (7 yrs) 52 mg intrauterine device  Take by intrauterine route.   meloxicam (MOBIC) 15 MG tablet Take 15 mg by mouth daily. Dr. Casper Harrison [facility]   montelukast (SINGULAIR) 10 MG tablet Take 10 mg by mouth daily.   primidone (MYSOLINE) 50 MG tablet Take 100 mg by mouth 2 (two) times daily.    Spacer/Aero-Holding Chambers (AEROCHAMBER PLUS WITH MASK) inhaler    SUMAtriptan (IMITREX) 100 MG tablet Take 1 tablet (100 mg total) by mouth every 2 (two) hours as needed for migraine. May repeat in 2 hours if headache persists or recurs.   traMADol (ULTRAM) 50 MG tablet Take 50 mg by mouth every 6 (six) hours as needed.   tretinoin (RETIN-A) 0.05 % cream Apply topically as needed.       10/11/2022    3:28 PM 10/04/2022    8:35 AM 01/14/2022   11:11 AM 07/01/2021    8:59 AM  GAD 7 : Generalized Anxiety Score  Nervous, Anxious, on Edge 0 1 0 1  Control/stop worrying 0 0 0 1  Worry too much - different things 0 0 0 1  Trouble relaxing 1 1 0 1  Restless 0 1 0 0  Easily annoyed or irritable 0 0 0 0  Afraid - awful might happen 0 0 0 0  Total GAD 7 Score 1 3 0 4  Anxiety Difficulty Not difficult at all Somewhat difficult  Not difficult at all       10/11/2022    3:28 PM 10/04/2022    8:35 AM 01/14/2022   11:11 AM  Depression screen PHQ 2/9  Decreased Interest 1 1 0  Down, Depressed, Hopeless '1 1 1  ' PHQ - 2 Score '2 2 1  ' Altered sleeping 0 1 2  Tired, decreased energy 1 1 0  Change in appetite 0 0 0  Feeling bad or failure about yourself  0 1 0  Trouble concentrating 0 1 0  Moving slowly or fidgety/restless 0 0 0  Suicidal thoughts 0 1 0  PHQ-9 Score '3 7 3  ' Difficult doing work/chores Not difficult at all Somewhat difficult     BP Readings from  Last 3 Encounters:  10/11/22 122/78  10/04/22 124/72  01/14/22 112/78    Physical Exam Vitals and nursing note reviewed.  Constitutional:      General: She is not in acute distress.    Appearance: Normal appearance. She is well-developed.  HENT:     Head: Normocephalic and atraumatic.  Cardiovascular:  Rate and Rhythm: Normal rate and regular rhythm.     Pulses: Normal pulses.     Heart sounds: No murmur heard. Pulmonary:     Effort: Pulmonary effort is normal. No respiratory distress.     Breath sounds: No wheezing or rhonchi.  Musculoskeletal:     Cervical back: Normal range of motion.  Lymphadenopathy:     Cervical: No cervical adenopathy.  Skin:    General: Skin is warm and dry.     Findings: No rash.  Neurological:     General: No focal deficit present.     Mental Status: She is alert and oriented to person, place, and time.  Psychiatric:        Mood and Affect: Mood normal.        Behavior: Behavior normal.     Wt Readings from Last 3 Encounters:  10/11/22 153 lb (69.4 kg)  10/04/22 153 lb (69.4 kg)  01/14/22 169 lb (76.7 kg)    BP 122/78   Pulse 73   Ht '5\' 2"'  (1.575 m)   Wt 153 lb (69.4 kg)   SpO2 97%   BMI 27.98 kg/m   Assessment and Plan: 1. Chronic migraine with aura without status migrainosus, not intractable Will try Imitrex and also give samples of Nurtec Call for Rx of which one is most beneficial. - SUMAtriptan (IMITREX) 100 MG tablet; Take 1 tablet (100 mg total) by mouth every 2 (two) hours as needed for migraine. May repeat in 2 hours if headache persists or recurs.  Dispense: 10 tablet; Refill: 0   Partially dictated using Editor, commissioning. Any errors are unintentional.  Halina Maidens, MD [facility]  10/11/2022

## 2022-10-18 ENCOUNTER — Encounter: Payer: Self-pay | Admitting: Internal Medicine

## 2022-11-21 ENCOUNTER — Ambulatory Visit
Admission: RE | Admit: 2022-11-21 | Discharge: 2022-11-21 | Disposition: A | Discharge status: Home / Self Care | Payer: BLUE CROSS/BLUE SHIELD | Source: Ambulatory Visit | Attending: Obstetrics & Gynecology | Admitting: Obstetrics & Gynecology

## 2022-11-21 DIAGNOSIS — Z1231 Encounter for screening mammogram for malignant neoplasm of breast: Secondary | ICD-10-CM | POA: Insufficient documentation

## 2022-11-29 ENCOUNTER — Other Ambulatory Visit: Payer: Self-pay | Admitting: Obstetrics & Gynecology

## 2022-11-29 DIAGNOSIS — R928 Other abnormal and inconclusive findings on diagnostic imaging of breast: Secondary | ICD-10-CM

## 2022-12-07 ENCOUNTER — Ambulatory Visit
Admission: RE | Admit: 2022-12-07 | Discharge: 2022-12-07 | Disposition: A | Discharge status: Home / Self Care | Payer: BLUE CROSS/BLUE SHIELD | Source: Ambulatory Visit | Attending: Obstetrics & Gynecology | Admitting: Obstetrics & Gynecology

## 2022-12-07 DIAGNOSIS — R928 Other abnormal and inconclusive findings on diagnostic imaging of breast: Secondary | ICD-10-CM

## 2022-12-25 ENCOUNTER — Other Ambulatory Visit: Payer: Self-pay | Admitting: Internal Medicine

## 2022-12-25 DIAGNOSIS — G43E09 Chronic migraine with aura, not intractable, without status migrainosus: Secondary | ICD-10-CM

## 2022-12-26 MED ORDER — SUMATRIPTAN SUCCINATE 100 MG PO TABS: 100.0000 mg | ORAL_TABLET | ORAL | 0 refills | Status: DC | PRN

## 2023-05-22 ENCOUNTER — Other Ambulatory Visit: Payer: Self-pay | Admitting: Internal Medicine

## 2023-05-22 DIAGNOSIS — F321 Major depressive disorder, single episode, moderate: Secondary | ICD-10-CM

## 2023-06-12 ENCOUNTER — Telehealth: Payer: Self-pay

## 2023-06-12 NOTE — Transitions of Care (Post Inpatient/ED Visit) (Signed)
   06/12/2023  Name: Nancy Vega MRN: [id number] DOB: [birth year]  Today's TOC FU Call Status: Today's TOC FU Call Status:: Unsuccessul Call (1st Attempt) Unsuccessful Call (1st Attempt) Date: 06/12/23  Attempted to reach the patient regarding the most recent Inpatient/ED visit.  Follow Up Plan: Additional outreach attempts will be made to reach the patient to complete the Transitions of Care (Post Inpatient/ED visit) call.   Cherryl Babin [facility]  Primary Care & Sports Medicine at [place], [facility] [address]  [place] [address] Office [phone]  Fax: [phone]

## 2023-06-13 NOTE — Transitions of Care (Post Inpatient/ED Visit) (Signed)
   06/13/2023  Name: Oyuki Hogan MRN: [id number] DOB: 28-Jan-1976  Today's TOC FU Call Status: Today's TOC FU Call Status:: Unsuccessful Call (2nd Attempt) Unsuccessful Call (1st Attempt) Date: 06/12/23 Unsuccessful Call (2nd Attempt) Date: 06/13/23  Attempted to reach the patient regarding the most recent Inpatient/ED visit.  Follow Up Plan: Additional outreach attempts will be made to reach the patient to complete the Transitions of Care (Post Inpatient/ED visit) call.   Stefani Baik [facility]  Primary Care & Sports Medicine at [facility], [facility] [address]  [place] [address] Office [phone]  Fax: [phone]

## 2023-06-19 ENCOUNTER — Ambulatory Visit: Payer: No Typology Code available for payment source | Admitting: Internal Medicine

## 2023-06-19 ENCOUNTER — Other Ambulatory Visit: Payer: Self-pay | Admitting: Internal Medicine

## 2023-06-19 VITALS — BP 122/74 | HR 107 | Ht 62.0 in | Wt 156.0 lb

## 2023-06-19 DIAGNOSIS — G43E09 Chronic migraine with aura, not intractable, without status migrainosus: Secondary | ICD-10-CM | POA: Diagnosis not present

## 2023-06-19 DIAGNOSIS — N3001 Acute cystitis with hematuria: Secondary | ICD-10-CM

## 2023-06-19 LAB — POCT URINALYSIS DIPSTICK
Bilirubin, UA: NEGATIVE
Glucose, UA: NEGATIVE
Ketones, UA: NEGATIVE
Nitrite, UA: NEGATIVE
Protein, UA: NEGATIVE
Spec Grav, UA: 1.01 (ref 1.010–1.025)
Urobilinogen, UA: 0.2 E.U./dL
pH, UA: 6 (ref 5.0–8.0)

## 2023-06-19 MED ORDER — SULFAMETHOXAZOLE-TRIMETHOPRIM 800-160 MG PO TABS
1.0000 | ORAL_TABLET | Freq: Two times a day (BID) | ORAL | 0 refills | Status: AC
Start: 2023-06-19 — End: 2023-06-29

## 2023-06-19 NOTE — Progress Notes (Signed)
Date:  06/19/2023   Name:  Nancy Vega   DOB:  01-31-1976   MRN:  [id number]   Chief Complaint: Urinary Tract Infection (Pt was in ER on 06/21 for flank pain/ abd pain. Still hurts to urinate. Urethra is sore. Stilling having pain in left flank. Pt woke up crying yesterday and felt pain in back so thought she was trying to pass another kidney stone.)  Migraine  This is a recurrent problem. Episode frequency: 4 times a month, lasting 3-5 days. The pain is located in the Right unilateral region. The quality of the pain is described as band-like and sharp. Pertinent negatives include no vomiting. She has tried triptans for the symptoms. The treatment provided moderate relief.  Dysuria  This is a new problem. The current episode started yesterday. The problem has been unchanged. The quality of the pain is described as shooting. The pain is mild. There has been no fever. Associated symptoms include frequency and urgency. Pertinent negatives include no chills, discharge, hematuria or vomiting. She has tried increased fluids for the symptoms.    Lab Results  Component Value Date   NA 141 10/04/2022   K 4.4 10/04/2022   CO2 19 (L) 10/04/2022   GLUCOSE 90 10/04/2022   BUN 16 10/04/2022   CREATININE 0.86 10/04/2022   CALCIUM 9.9 10/04/2022   EGFR 85 10/04/2022   GFRNONAA 84 10/12/2020   Lab Results  Component Value Date   CHOL 206 (H) 10/04/2022   HDL 57 10/04/2022   LDLCALC 128 (H) 10/04/2022   TRIG 119 10/04/2022   CHOLHDL 3.6 10/04/2022   Lab Results  Component Value Date   TSH 2.200 10/04/2022   Lab Results  Component Value Date   HGBA1C 5.2 10/04/2022   Lab Results  Component Value Date   WBC 9.1 10/04/2022   HGB 13.5 10/04/2022   HCT 39.1 10/04/2022   MCV 94 10/04/2022   PLT 283 10/04/2022   Lab Results  Component Value Date   ALT 29 10/04/2022   AST 23 10/04/2022   ALKPHOS 111 10/04/2022   BILITOT 0.4 10/04/2022   No results found for: "25OHVITD2",  "25OHVITD3", "VD25OH"   Review of Systems  Constitutional:  Negative for chills and fatigue.  Respiratory:  Negative for chest tightness and shortness of breath.   Cardiovascular:  Negative for chest pain.  Gastrointestinal:  Negative for vomiting.  Genitourinary:  Positive for dysuria, frequency and urgency. Negative for hematuria.  Neurological:  Positive for headaches.    Patient Active Problem List   Diagnosis Date Noted   Chronic migraine with aura without status migrainosus, not intractable 10/11/2022   TMJ arthralgia 10/04/2022   S/P ACL reconstruction 02/24/2020   Chronic pain of both knees 09/23/2019   Current moderate episode of major depressive disorder without prior episode (HCC) 08/12/2019   Left thyroid nodule 06/17/2019   Chronic bronchitis, simple (HCC) 07/18/2018   Fibrocystic breast changes, bilateral 03/21/2016   Anxiety disorder 07/13/2015   Asthma in adult, mild intermittent, uncomplicated 07/13/2015   Essential tremor 07/13/2015   Irritable bowel syndrome with constipation 07/13/2015    Allergies  Allergen Reactions   Codeine Nausea And Vomiting   Doxycycline Nausea And Vomiting   Oxycodone Hives   Amoxicillin Hives   Cefdinir Other (See Comments)   Hydrocodone Other (See Comments)    syncope    Iodine Solution [Povidone Iodine]     Allergy to cleaning Iodine solution    Nsaids Other (See Comments)  Tremor intensity   Tamiflu [Oseltamivir] Hives   Zithromax [Azithromycin] Hives and Other (See Comments)   Buspirone Other (See Comments)    Dizziness and fatigue    Past Surgical History:  Procedure Laterality Date   APPENDECTOMY  06/2015   BREAST BIOPSY Left 04/2016   fibroadenoma/ us/bx   BREAST BIOPSY Right 2012   neg   BREAST BIOPSY Right 09/28/2021   u/s bx, ribbon clip, 9:00 9 cmfn, path pend   BREAST EXCISIONAL BIOPSY Right 2016   neg   KNEE SURGERY Right 2011   TONSILLECTOMY Bilateral     Social History   Tobacco Use    Smoking status: Never   Smokeless tobacco: Never  Substance Use Topics   Alcohol use: Yes    Alcohol/week: 2.0 standard drinks of alcohol    Types: 2 Standard drinks or equivalent per week    Comment: occassion   Drug use: No     Medication list has been reviewed and updated.  Current Meds  Medication Sig   albuterol (VENTOLIN HFA) 108 (90 Base) MCG/ACT inhaler Inhale into the lungs.   azelastine (ASTELIN) 0.1 % nasal spray Place into both nostrils.   budesonide (PULMICORT) 0.5 MG/2ML nebulizer solution Take by nebulization.   buPROPion (WELLBUTRIN XL) 150 MG 24 hr tablet TAKE 1 TABLET BY MOUTH IN THE MORNING AND 1 AT BEDTIME.   levocetirizine (XYZAL) 5 MG tablet Take 5 mg by mouth daily.   Levonorgestrel (MIRENA, 52 MG, IU) Mirena 20 mcg/24 hours (7 yrs) 52 mg intrauterine device  Take by intrauterine route.   meloxicam (MOBIC) 15 MG tablet Take 15 mg by mouth daily. Dr. Rosendo Gros [facility]   montelukast (SINGULAIR) 10 MG tablet Take 10 mg by mouth daily.   primidone (MYSOLINE) 50 MG tablet Take 100 mg by mouth 2 (two) times daily.    Spacer/Aero-Holding Chambers (AEROCHAMBER PLUS WITH MASK) inhaler    sulfamethoxazole-trimethoprim (BACTRIM DS) 800-160 MG tablet Take 1 tablet by mouth 2 (two) times daily for 10 days.   SUMAtriptan (IMITREX) 100 MG tablet Take 1 tablet (100 mg total) by mouth every 2 (two) hours as needed for migraine. May repeat in 2 hours if headache persists or recurs.   traMADol (ULTRAM) 50 MG tablet Take 50 mg by mouth every 6 (six) hours as needed.   tretinoin (RETIN-A) 0.05 % cream Apply topically as needed.       06/19/2023    3:26 PM 10/11/2022    3:28 PM 10/04/2022    8:35 AM 01/14/2022   11:11 AM  GAD 7 : Generalized Anxiety Score  Nervous, Anxious, on Edge 1 0 1 0  Control/stop worrying 1 0 0 0  Worry too much - different things 1 0 0 0  Trouble relaxing 1 1 1  0  Restless 1 0 1 0  Easily annoyed or irritable 0 0 0 0  Afraid - awful might  happen 0 0 0 0  Total GAD 7 Score 5 1 3  0  Anxiety Difficulty Not difficult at all Not difficult at all Somewhat difficult        06/19/2023    3:25 PM 10/11/2022    3:28 PM 10/04/2022    8:35 AM  Depression screen PHQ 2/9  Decreased Interest 1 1 1   Down, Depressed, Hopeless 1 1 1   PHQ - 2 Score 2 2 2   Altered sleeping 1 0 1  Tired, decreased energy 1 1 1   Change in appetite 1 0 0  Feeling bad or failure about yourself  0 0 1  Trouble concentrating 0 0 1  Moving slowly or fidgety/restless 0 0 0  Suicidal thoughts 0 0 1  PHQ-9 Score 5 3 7   Difficult doing work/chores Not difficult at all Not difficult at all Somewhat difficult    BP Readings from Last 3 Encounters:  06/19/23 122/74  10/11/22 122/78  10/04/22 124/72    Physical Exam Vitals and nursing note reviewed.  Constitutional:      Appearance: She is well-developed.  Cardiovascular:     Rate and Rhythm: Normal rate and regular rhythm.     Heart sounds: Normal heart sounds.  Pulmonary:     Effort: Pulmonary effort is normal. No respiratory distress.     Breath sounds: Normal breath sounds.  Abdominal:     General: Bowel sounds are normal.     Palpations: Abdomen is soft.     Tenderness: There is no abdominal tenderness. There is no right CVA tenderness, left CVA tenderness, guarding or rebound.  Musculoskeletal:     Cervical back: Normal range of motion.  Skin:    General: Skin is warm and dry.     Wt Readings from Last 3 Encounters:  06/19/23 156 lb (70.8 kg)  10/11/22 153 lb (69.4 kg)  10/04/22 153 lb (69.4 kg)    BP 122/74   Pulse (!) 107   Ht 5\' 2"  (1.575 m)   Wt 156 lb (70.8 kg)   SpO2 98%   BMI 28.53 kg/m   Assessment and Plan:  Problem List Items Addressed This Visit     Chronic migraine with aura without status migrainosus, not intractable (Chronic)    Was doing fairly well on Imitrex; now having more persistent HA Will give samples of Nurtec; consider every other day dosing for  prevention      Other Visit Diagnoses     Acute cystitis with hematuria    -  Primary   CT stone study last week was normal. [place] was normal.  she was discharge with no meds apparently now has UTI with + UA; will culture treat empirically   Relevant Medications   sulfamethoxazole-trimethoprim (BACTRIM DS) 800-160 MG tablet   Other Relevant Orders   POCT Urinalysis Dipstick (Completed)   Urine Culture       No follow-ups on file.   Partially dictated using Dragon software, any errors are not intentional.  Reubin Milan, MD [facility] [place], [place]

## 2023-06-19 NOTE — Assessment & Plan Note (Signed)
Was doing fairly well on Imitrex; now having more persistent HA Will give samples of Nurtec; consider every other day dosing for prevention

## 2023-06-21 LAB — URINE CULTURE

## 2023-08-16 ENCOUNTER — Other Ambulatory Visit: Payer: Self-pay | Admitting: Internal Medicine

## 2023-09-27 ENCOUNTER — Other Ambulatory Visit: Payer: Self-pay

## 2023-09-27 DIAGNOSIS — F321 Major depressive disorder, single episode, moderate: Secondary | ICD-10-CM

## 2023-09-27 DIAGNOSIS — G43E09 Chronic migraine with aura, not intractable, without status migrainosus: Secondary | ICD-10-CM

## 2023-09-27 MED ORDER — SUMATRIPTAN SUCCINATE 100 MG PO TABS: 100.0000 mg | ORAL_TABLET | ORAL | 0 refills | Status: AC | PRN

## 2023-09-27 MED ORDER — BUPROPION HCL ER (XL) 150 MG PO TB24: ORAL_TABLET | ORAL | 1 refills | Status: AC

## 2023-09-27 MED ORDER — MONTELUKAST SODIUM 10 MG PO TABS: 10.0000 mg | ORAL_TABLET | Freq: Every day | ORAL | 0 refills | Status: AC

## 2023-10-05 ENCOUNTER — Encounter: Payer: No Typology Code available for payment source | Admitting: Internal Medicine

## 2024-02-27 ENCOUNTER — Encounter: Payer: Self-pay | Admitting: Internal Medicine
